# Patient Record
Sex: Female | Born: 1935 | Race: White | Hispanic: No | Smoking: Never smoker
Health system: Southern US, Community
[De-identification: ages and names within clinical notes are randomized; demographics above are authoritative.]

## PROBLEM LIST (undated history)

## (undated) DIAGNOSIS — M24529 Contracture, unspecified elbow: Secondary | ICD-10-CM

## (undated) DIAGNOSIS — E538 Deficiency of other specified B group vitamins: Secondary | ICD-10-CM

## (undated) DIAGNOSIS — E559 Vitamin D deficiency, unspecified: Secondary | ICD-10-CM

## (undated) DIAGNOSIS — R1312 Dysphagia, oropharyngeal phase: Secondary | ICD-10-CM

## (undated) DIAGNOSIS — G238 Other specified degenerative diseases of basal ganglia: Principal | ICD-10-CM

## (undated) DIAGNOSIS — Z7189 Other specified counseling: Secondary | ICD-10-CM

## (undated) DIAGNOSIS — E785 Hyperlipidemia, unspecified: Secondary | ICD-10-CM

## (undated) DIAGNOSIS — E039 Hypothyroidism, unspecified: Secondary | ICD-10-CM

## (undated) DIAGNOSIS — I10 Essential (primary) hypertension: Secondary | ICD-10-CM

## (undated) DIAGNOSIS — F329 Major depressive disorder, single episode, unspecified: Secondary | ICD-10-CM

## (undated) DIAGNOSIS — R4701 Aphasia: Secondary | ICD-10-CM

## (undated) DIAGNOSIS — S8253XA Displaced fracture of medial malleolus of unspecified tibia, initial encounter for closed fracture: Secondary | ICD-10-CM

## (undated) DIAGNOSIS — T563X1A Toxic effect of cadmium and its compounds, accidental (unintentional), initial encounter: Secondary | ICD-10-CM

## (undated) DIAGNOSIS — Z853 Personal history of malignant neoplasm of breast: Secondary | ICD-10-CM

## (undated) DIAGNOSIS — Z79899 Other long term (current) drug therapy: Secondary | ICD-10-CM

## (undated) DIAGNOSIS — S2239XA Fracture of one rib, unspecified side, initial encounter for closed fracture: Secondary | ICD-10-CM

## (undated) DIAGNOSIS — T560X1A Toxic effect of lead and its compounds, accidental (unintentional), initial encounter: Secondary | ICD-10-CM

## (undated) DIAGNOSIS — R32 Unspecified urinary incontinence: Secondary | ICD-10-CM

## (undated) DIAGNOSIS — R42 Dizziness and giddiness: Secondary | ICD-10-CM

## (undated) DIAGNOSIS — I5032 Chronic diastolic (congestive) heart failure: Secondary | ICD-10-CM

## (undated) DIAGNOSIS — R159 Full incontinence of feces: Secondary | ICD-10-CM

## (undated) DIAGNOSIS — F411 Generalized anxiety disorder: Secondary | ICD-10-CM

## (undated) HISTORY — DX: Hyperlipidemia, unspecified: E78.5

## (undated) HISTORY — DX: Other specified degenerative diseases of basal ganglia: G23.8

## (undated) HISTORY — DX: Essential (primary) hypertension: I10

## (undated) HISTORY — DX: Displaced fracture of medial malleolus of unspecified tibia, initial encounter for closed fracture: S82.53XA

## (undated) HISTORY — DX: Hypothyroidism, unspecified: E03.9

## (undated) HISTORY — DX: Contracture, unspecified elbow: M24.529

## (undated) HISTORY — DX: Vitamin D deficiency, unspecified: E55.9

## (undated) HISTORY — DX: Chronic diastolic (congestive) heart failure: I50.32

## (undated) HISTORY — DX: Personal history of malignant neoplasm of breast: Z85.3

## (undated) HISTORY — DX: Major depressive disorder, single episode, unspecified: F32.9

## (undated) HISTORY — DX: Toxic effect of cadmium and its compounds, accidental (unintentional), initial encounter: T56.3X1A

## (undated) HISTORY — DX: Generalized anxiety disorder: F41.1

## (undated) HISTORY — DX: Aphasia: R47.01

## (undated) HISTORY — DX: Other long term (current) drug therapy: Z79.899

## (undated) HISTORY — DX: Fracture of one rib, unspecified side, initial encounter for closed fracture: S22.39XA

## (undated) HISTORY — DX: Other specified counseling: Z71.89

## (undated) HISTORY — DX: Toxic effect of lead and its compounds, accidental (unintentional), initial encounter: T56.0X1A

## (undated) HISTORY — DX: Deficiency of other specified B group vitamins: E53.8

## (undated) HISTORY — DX: Unspecified urinary incontinence: R32

## (undated) HISTORY — DX: Dysphagia, oropharyngeal phase: R13.12

## (undated) HISTORY — DX: Dizziness and giddiness: R42

## (undated) HISTORY — DX: Full incontinence of feces: R15.9

---

## 1999-09-20 HISTORY — PX: OTHER SURGICAL HISTORY: SHX169

## 2004-08-16 LAB — CBC AND DIFFERENTIAL
HEMATOCRIT: 40 % (ref 36–46)
HEMOGLOBIN: 13.3 g/dL (ref 12.0–16.0)
Platelets: 275 10*3/uL (ref 150–399)
WBC: 7.9 10*3/mL

## 2004-08-16 LAB — BASIC METABOLIC PANEL
BUN: 29 mg/dL — AB (ref 4–21)
Creatinine: 0.7 mg/dL (ref 0.5–1.1)
GLUCOSE: 106 mg/dL
Potassium: 4.2 mmol/L (ref 3.4–5.3)
Sodium: 141 mmol/L (ref 137–147)

## 2004-09-19 DIAGNOSIS — Z853 Personal history of malignant neoplasm of breast: Secondary | ICD-10-CM

## 2004-09-19 HISTORY — PX: BREAST LUMPECTOMY: SHX2

## 2004-09-19 HISTORY — DX: Personal history of malignant neoplasm of breast: Z85.3

## 2008-11-13 ENCOUNTER — Encounter: Admission: RE | Admit: 2008-11-13 | Discharge: 2008-12-08 | Payer: Self-pay | Admitting: Otolaryngology

## 2009-04-21 ENCOUNTER — Encounter: Admission: RE | Admit: 2009-04-21 | Discharge: 2009-04-21 | Payer: Self-pay | Admitting: Family Medicine

## 2009-08-11 ENCOUNTER — Emergency Department (HOSPITAL_BASED_OUTPATIENT_CLINIC_OR_DEPARTMENT_OTHER): Admission: EM | Admit: 2009-08-11 | Discharge: 2009-08-11 | Payer: Self-pay | Admitting: Emergency Medicine

## 2009-08-11 ENCOUNTER — Ambulatory Visit: Payer: Self-pay | Admitting: Diagnostic Radiology

## 2009-09-19 DIAGNOSIS — F411 Generalized anxiety disorder: Secondary | ICD-10-CM

## 2009-09-19 DIAGNOSIS — R42 Dizziness and giddiness: Secondary | ICD-10-CM

## 2009-09-19 DIAGNOSIS — I5032 Chronic diastolic (congestive) heart failure: Secondary | ICD-10-CM

## 2009-09-19 DIAGNOSIS — E039 Hypothyroidism, unspecified: Secondary | ICD-10-CM

## 2009-09-19 DIAGNOSIS — I10 Essential (primary) hypertension: Secondary | ICD-10-CM

## 2009-09-19 DIAGNOSIS — S2239XA Fracture of one rib, unspecified side, initial encounter for closed fracture: Secondary | ICD-10-CM

## 2009-09-19 HISTORY — DX: Dizziness and giddiness: R42

## 2009-09-19 HISTORY — DX: Fracture of one rib, unspecified side, initial encounter for closed fracture: S22.39XA

## 2009-09-19 HISTORY — DX: Generalized anxiety disorder: F41.1

## 2009-09-19 HISTORY — DX: Hypothyroidism, unspecified: E03.9

## 2009-09-19 HISTORY — DX: Essential (primary) hypertension: I10

## 2009-09-19 HISTORY — DX: Chronic diastolic (congestive) heart failure: I50.32

## 2009-12-08 ENCOUNTER — Emergency Department (HOSPITAL_COMMUNITY): Admission: EM | Admit: 2009-12-08 | Discharge: 2009-12-08 | Payer: Self-pay | Admitting: Emergency Medicine

## 2009-12-10 ENCOUNTER — Emergency Department (HOSPITAL_COMMUNITY)
Admission: EM | Admit: 2009-12-10 | Discharge: 2009-12-10 | Payer: Self-pay | Source: Home / Self Care | Admitting: Emergency Medicine

## 2009-12-27 ENCOUNTER — Inpatient Hospital Stay (HOSPITAL_COMMUNITY): Admission: EM | Admit: 2009-12-27 | Discharge: 2010-01-01 | Payer: Self-pay | Admitting: Emergency Medicine

## 2009-12-28 ENCOUNTER — Encounter (INDEPENDENT_AMBULATORY_CARE_PROVIDER_SITE_OTHER): Payer: Self-pay | Admitting: Internal Medicine

## 2010-03-26 ENCOUNTER — Emergency Department (HOSPITAL_COMMUNITY): Admission: EM | Admit: 2010-03-26 | Discharge: 2010-03-26 | Payer: Self-pay | Admitting: Emergency Medicine

## 2010-05-30 ENCOUNTER — Emergency Department (HOSPITAL_COMMUNITY)
Admission: EM | Admit: 2010-05-30 | Discharge: 2010-05-31 | Payer: Self-pay | Source: Home / Self Care | Admitting: Emergency Medicine

## 2010-09-19 DIAGNOSIS — R1312 Dysphagia, oropharyngeal phase: Secondary | ICD-10-CM

## 2010-09-19 DIAGNOSIS — E559 Vitamin D deficiency, unspecified: Secondary | ICD-10-CM

## 2010-09-19 DIAGNOSIS — T563X1A Toxic effect of cadmium and its compounds, accidental (unintentional), initial encounter: Secondary | ICD-10-CM

## 2010-09-19 DIAGNOSIS — T560X1A Toxic effect of lead and its compounds, accidental (unintentional), initial encounter: Secondary | ICD-10-CM

## 2010-09-19 HISTORY — DX: Toxic effect of cadmium and its compounds, accidental (unintentional), initial encounter: T56.3X1A

## 2010-09-19 HISTORY — DX: Toxic effect of lead and its compounds, accidental (unintentional), initial encounter: T56.0X1A

## 2010-09-19 HISTORY — DX: Vitamin D deficiency, unspecified: E55.9

## 2010-09-19 HISTORY — DX: Dysphagia, oropharyngeal phase: R13.12

## 2010-10-10 ENCOUNTER — Encounter: Payer: Self-pay | Admitting: General Practice

## 2010-12-02 LAB — DIFFERENTIAL
Eosinophils Absolute: 0 10*3/uL (ref 0.0–0.7)
Eosinophils Relative: 0 % (ref 0–5)
Lymphocytes Relative: 8 % — ABNORMAL LOW (ref 12–46)
Monocytes Absolute: 0.6 10*3/uL (ref 0.1–1.0)
Neutrophils Relative %: 86 % — ABNORMAL HIGH (ref 43–77)

## 2010-12-02 LAB — COMPREHENSIVE METABOLIC PANEL
ALT: 23 U/L (ref 0–35)
Albumin: 4.1 g/dL (ref 3.5–5.2)
BUN: 21 mg/dL (ref 6–23)
CO2: 28 mEq/L (ref 19–32)
Calcium: 9.2 mg/dL (ref 8.4–10.5)
Chloride: 100 mEq/L (ref 96–112)
GFR calc non Af Amer: >60 mL/min (ref >60)
Total Protein: 7.5 g/dL (ref 6.0–8.3)

## 2010-12-02 LAB — URINALYSIS, ROUTINE W REFLEX MICROSCOPIC
Glucose, UA: NEGATIVE mg/dL
Protein, ur: NEGATIVE mg/dL
Urobilinogen, UA: 0.2 mg/dL (ref 0.0–1.0)

## 2010-12-02 LAB — CBC
HCT: 41.2 % (ref 36.0–46.0)
Hemoglobin: 14.6 g/dL (ref 12.0–15.0)
MCH: 30.8 pg (ref 26.0–34.0)
MCHC: 35.4 g/dL (ref 30.0–36.0)
MCV: 87.1 fL (ref 78.0–100.0)

## 2010-12-08 LAB — POCT CARDIAC MARKERS
CKMB, poc: 1.2 ng/mL (ref 1.0–8.0)
Myoglobin, poc: 115 ng/mL (ref 12–200)
Troponin i, poc: <0.05 ng/mL (ref 0.00–0.09)

## 2010-12-08 LAB — GLUCOSE, CAPILLARY

## 2010-12-08 LAB — CBC
HCT: 35.9 % — ABNORMAL LOW (ref 36.0–46.0)
HCT: 36.5 % (ref 36.0–46.0)
Hemoglobin: 12.3 g/dL (ref 12.0–15.0)
Hemoglobin: 12.4 g/dL (ref 12.0–15.0)
MCHC: 33.8 g/dL (ref 30.0–36.0)
MCHC: 34.2 g/dL (ref 30.0–36.0)
MCV: 91.4 fL (ref 78.0–100.0)
MCV: 91.7 fL (ref 78.0–100.0)
Platelets: 203 K/uL (ref 150–400)
Platelets: 237 K/uL (ref 150–400)
RBC: 3.92 MIL/uL (ref 3.87–5.11)
RBC: 3.99 MIL/uL (ref 3.87–5.11)
RDW: 13.1 % (ref 11.5–15.5)
RDW: 13.4 % (ref 11.5–15.5)
WBC: 7.2 K/uL (ref 4.0–10.5)
WBC: 8.8 K/uL (ref 4.0–10.5)

## 2010-12-08 LAB — CARDIAC PANEL(CRET KIN+CKTOT+MB+TROPI)
CK, MB: 2.7 ng/mL (ref 0.3–4.0)
CK, MB: 3.1 ng/mL (ref 0.3–4.0)
Relative Index: 2 (ref 0.0–2.5)
Relative Index: 2.1 (ref 0.0–2.5)
Total CK: 137 U/L (ref 7–177)
Total CK: 150 U/L (ref 7–177)
Troponin I: 0.02 ng/mL (ref 0.00–0.06)

## 2010-12-08 LAB — DIFFERENTIAL
Basophils Absolute: 0 10*3/uL (ref 0.0–0.1)
Basophils Relative: 0 % (ref 0–1)
Basophils Relative: 1 % (ref 0–1)
Eosinophils Absolute: 0.1 10*3/uL (ref 0.0–0.7)
Eosinophils Relative: 1 % (ref 0–5)
Neutro Abs: 4.8 10*3/uL (ref 1.7–7.7)
Neutrophils Relative %: 74 % (ref 43–77)

## 2010-12-08 LAB — COMPREHENSIVE METABOLIC PANEL
AST: 28 U/L (ref 0–37)
Alkaline Phosphatase: 75 U/L (ref 39–117)
BUN: 8 mg/dL (ref 6–23)
CO2: 28 mEq/L (ref 19–32)
Calcium: 8.6 mg/dL (ref 8.4–10.5)
GFR calc Af Amer: >60 mL/min (ref >60)
Sodium: 136 mEq/L (ref 135–145)

## 2010-12-08 LAB — D-DIMER, QUANTITATIVE: D-Dimer, Quant: 1.82 ug/mL-FEU — ABNORMAL HIGH (ref 0.00–0.48)

## 2010-12-08 LAB — BASIC METABOLIC PANEL
Calcium: 8.2 mg/dL — ABNORMAL LOW (ref 8.4–10.5)
Chloride: 101 mEq/L (ref 96–112)
GFR calc Af Amer: >60 mL/min (ref >60)
Glucose, Bld: 97 mg/dL (ref 70–99)
Sodium: 132 mEq/L — ABNORMAL LOW (ref 135–145)

## 2010-12-08 LAB — URINALYSIS, ROUTINE W REFLEX MICROSCOPIC
Bilirubin Urine: NEGATIVE
Urobilinogen, UA: 0.2 mg/dL (ref 0.0–1.0)

## 2010-12-08 LAB — CORTISOL: Cortisol, Plasma: 24.8 ug/dL

## 2010-12-08 LAB — MAGNESIUM: Magnesium: 2.1 mg/dL (ref 1.5–2.5)

## 2010-12-08 LAB — TSH: TSH: 5.967 u[IU]/mL — ABNORMAL HIGH (ref 0.350–4.500)

## 2011-03-04 ENCOUNTER — Emergency Department (HOSPITAL_COMMUNITY)
Admission: EM | Admit: 2011-03-04 | Discharge: 2011-03-04 | Disposition: A | Discharge status: Home / Self Care | Payer: Medicare Other | Attending: Emergency Medicine | Admitting: Emergency Medicine

## 2011-03-04 DIAGNOSIS — R42 Dizziness and giddiness: Secondary | ICD-10-CM | POA: Insufficient documentation

## 2011-03-04 DIAGNOSIS — R040 Epistaxis: Secondary | ICD-10-CM | POA: Insufficient documentation

## 2011-03-04 DIAGNOSIS — R112 Nausea with vomiting, unspecified: Secondary | ICD-10-CM | POA: Insufficient documentation

## 2011-03-04 LAB — CBC
MCHC: 33.3 g/dL (ref 30.0–36.0)
MCV: 85.2 fL (ref 78.0–100.0)
Platelets: 229 10*3/uL (ref 150–400)
RDW: 13.3 % (ref 11.5–15.5)
WBC: 7.3 10*3/uL (ref 4.0–10.5)

## 2011-03-04 LAB — BASIC METABOLIC PANEL
Calcium: 8.7 mg/dL (ref 8.4–10.5)
Creatinine, Ser: 0.81 mg/dL (ref 0.50–1.10)
GFR calc non Af Amer: >60 mL/min (ref >60)
Glucose, Bld: 101 mg/dL — ABNORMAL HIGH (ref 70–99)
Sodium: 136 mEq/L (ref 135–145)

## 2011-03-04 LAB — PROTIME-INR: INR: 1.11 (ref 0.00–1.49)

## 2011-03-04 LAB — DIFFERENTIAL
Basophils Absolute: 0 10*3/uL (ref 0.0–0.1)
Eosinophils Absolute: 0.1 10*3/uL (ref 0.0–0.7)
Eosinophils Relative: 1 % (ref 0–5)

## 2011-03-04 LAB — TYPE AND SCREEN: Antibody Screen: NEGATIVE

## 2011-04-15 IMAGING — CR DG RIBS W/ CHEST 3+V*L*
3 series · 3 of 3 positions shown · non-contrast
Comparison: 08/11/2009

CLINICAL DATA: Fall, left-sided rib pain

LEFT RIBS AND CHEST - 3+ VIEW

[w chest pa]
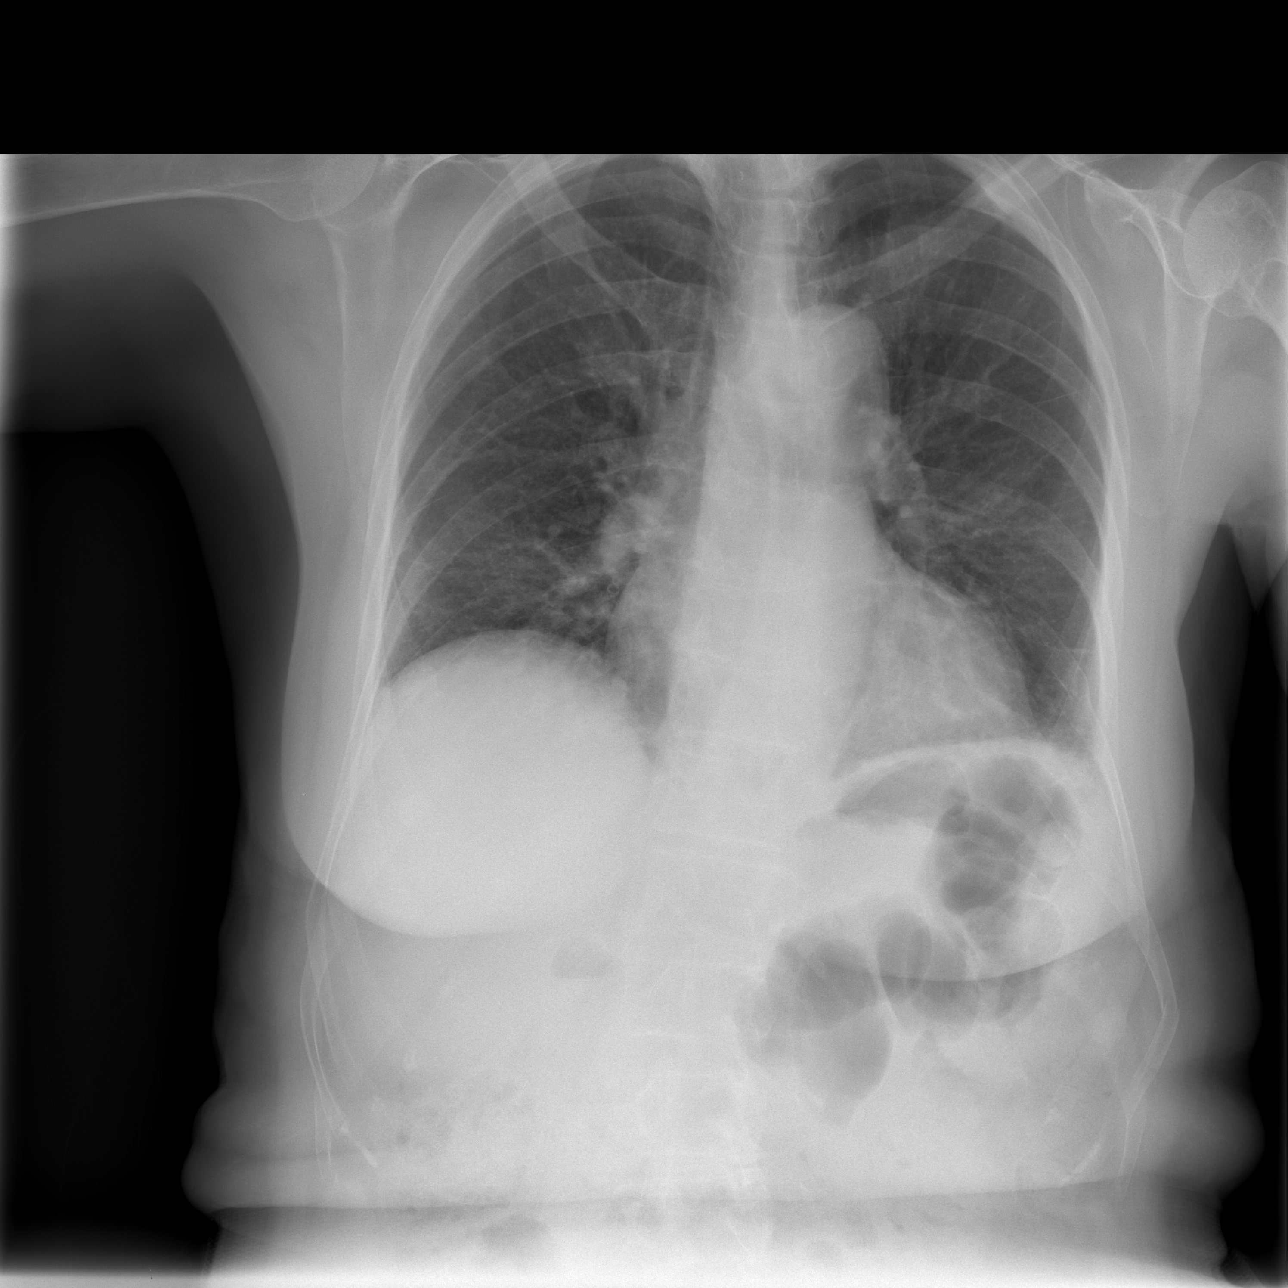

[w ribs ap/pa upper left *]
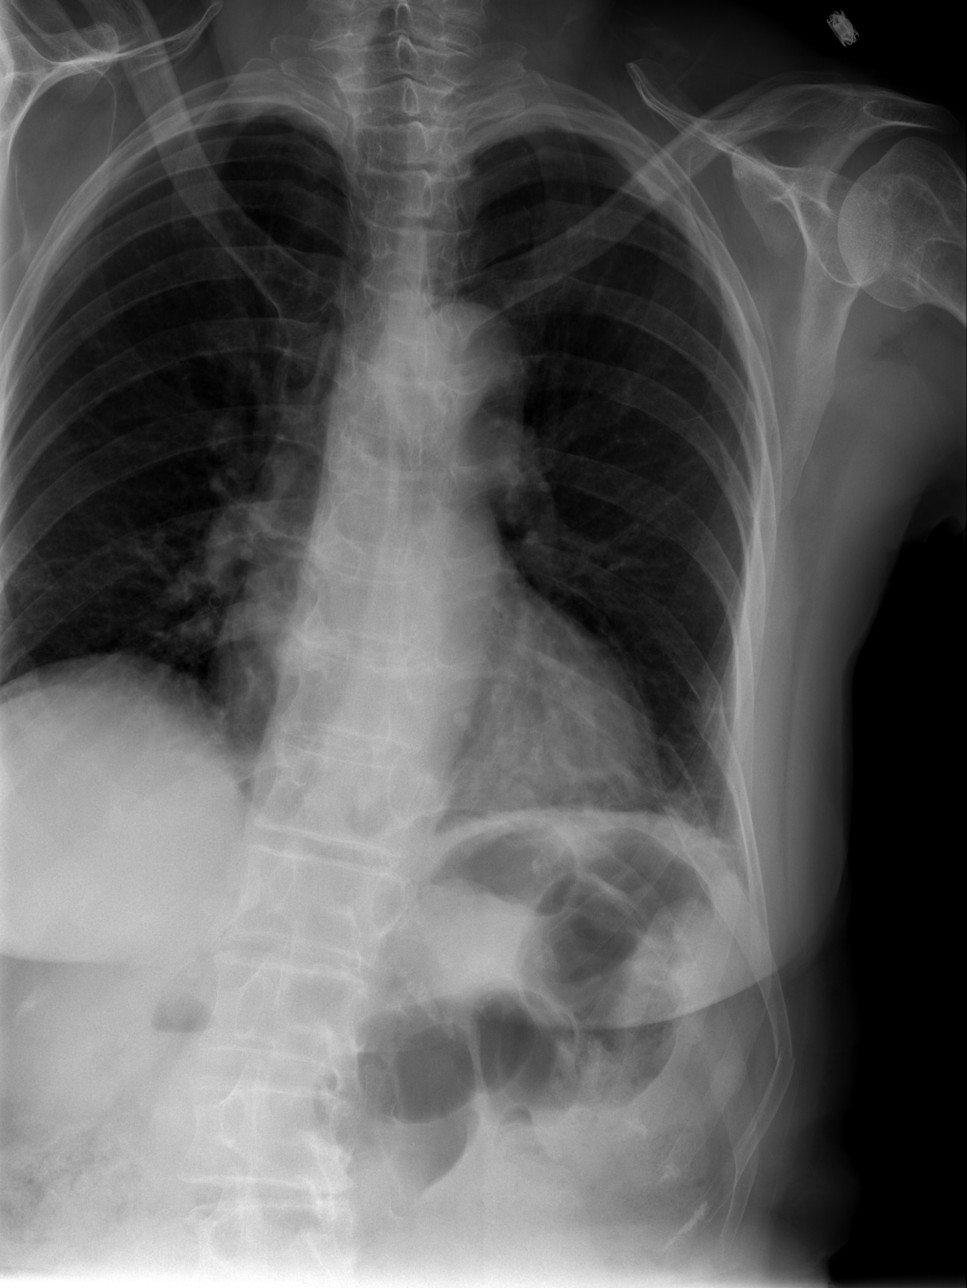

[w ribs ap/pa lower left *]
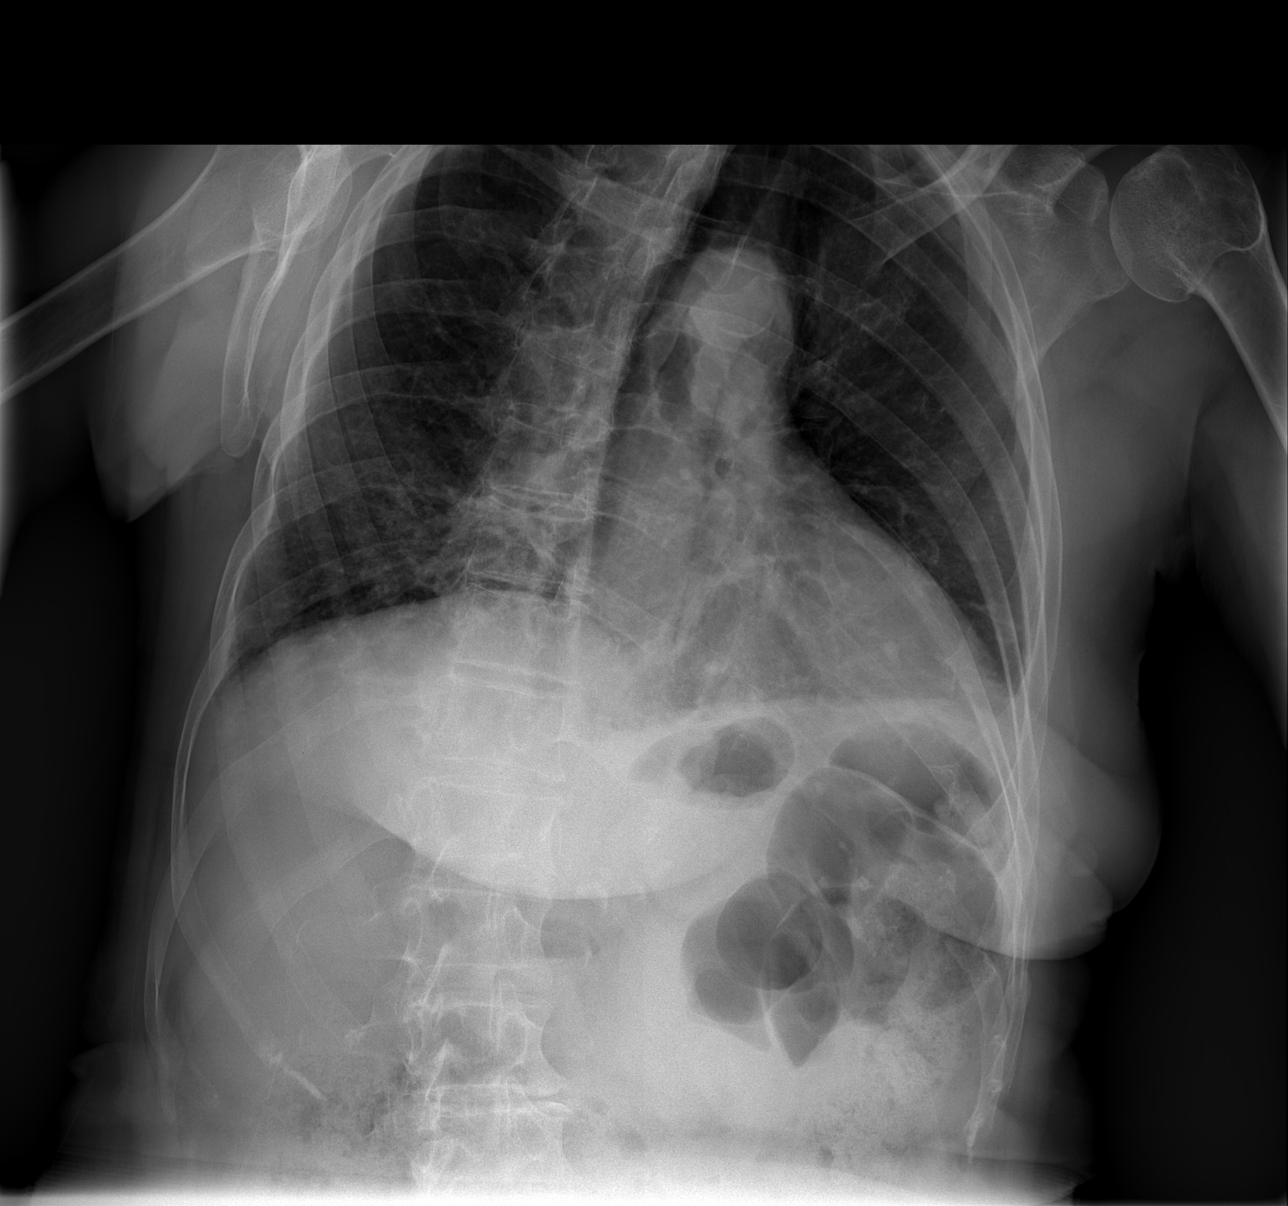

[3 of 3 positions shown; findings below may reference images not displayed]

FINDINGS: Remote fractures of the left ninth and tenth posterior
ribs again noted.  There is a new acute lateral left 10th rib
fracture.  Rotatory curvature of the thoracolumbar spine is noted
which is likely positional.  No pneumothorax.  Left basilar
atelectasis is present.  Heart size is at the upper limits of
normal.  Right lung is clear.
IMPRESSION: Acute left lateral tenth rib fracture.  No pneumothorax.

## 2011-04-19 IMAGING — CT CT ANGIO CHEST
2 of 7 series · 19 of 46 positions shown · IV contrast (APPLIED)
Comparison: CXR 12/29/2009.

CLINICAL DATA: Elevated D-dimer.  Short of breath. Chest pain.
Evaluate for PE.

CT ANGIOGRAPHY CHEST WITH CONTRAST
TECHNIQUE: Multidetector CT imaging of the chest was performed
using the standard protocol during bolus administration of
intravenous contrast.  Multiplanar CT image reconstructions
including MIPs were obtained to evaluate the vascular anatomy.
Contrast:  80 ml Dmnipaque-1GG IV.

[Series 6: pe thins @ 1mm · axial · 0.70mm/px · z∈[-271,-14]mm · 16 of 291 slices shown]
[im 17/291  lung]
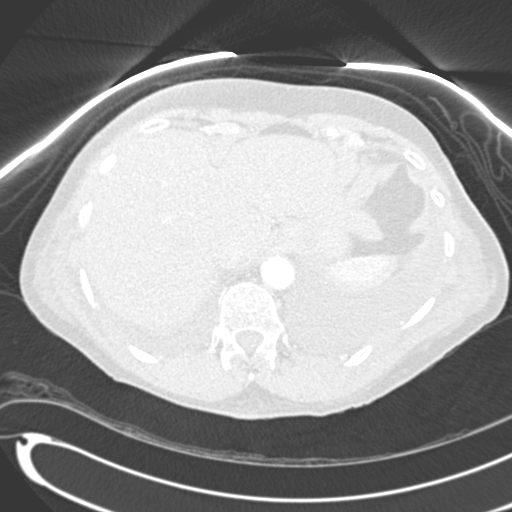
[im 33/291  soft-tissue]
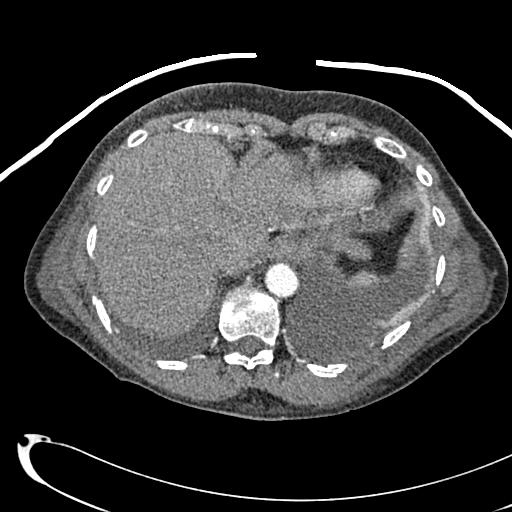
[im 49/291  lung]
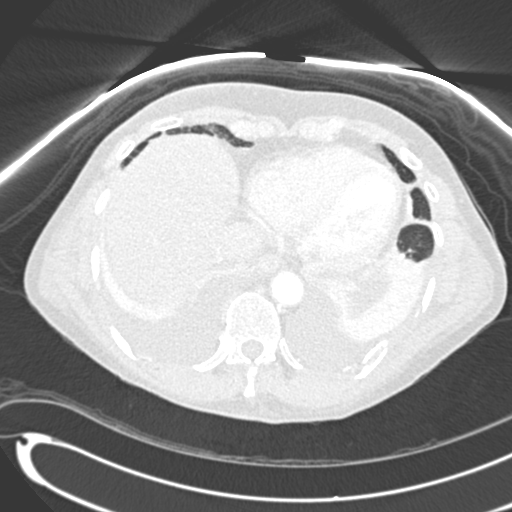
[im 65/291  soft-tissue]
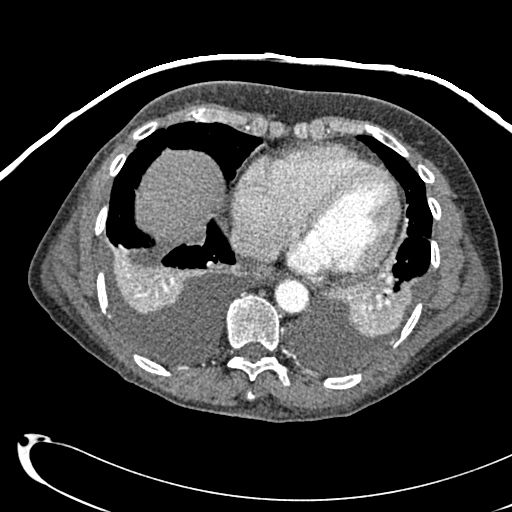
[im 81/291  lung]
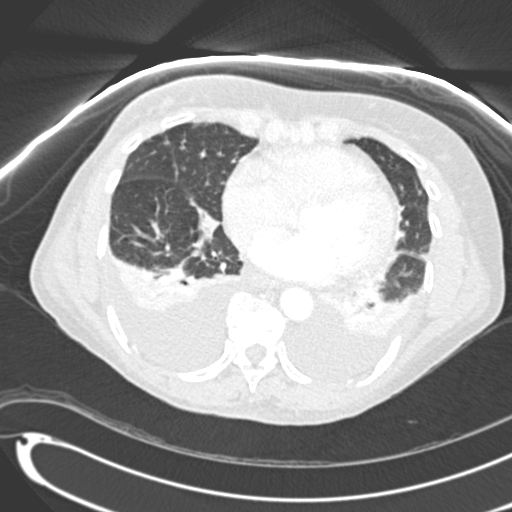
[im 97/291  soft-tissue]
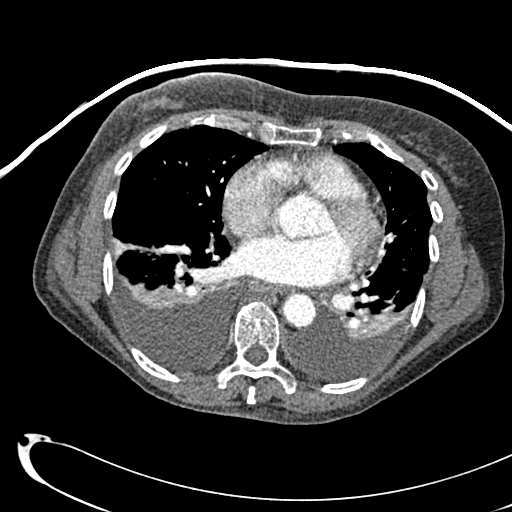
[im 113/291  lung]
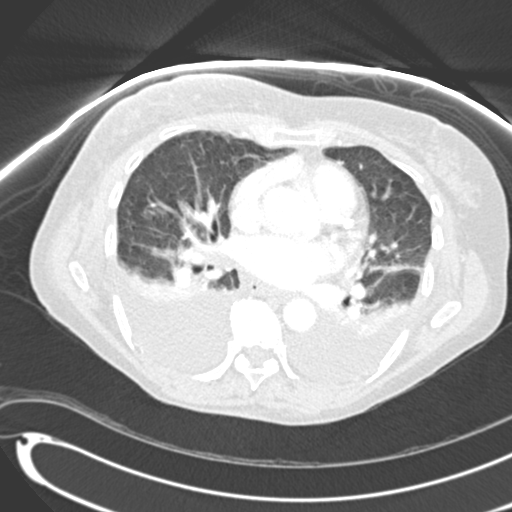
[im 129/291  soft-tissue]
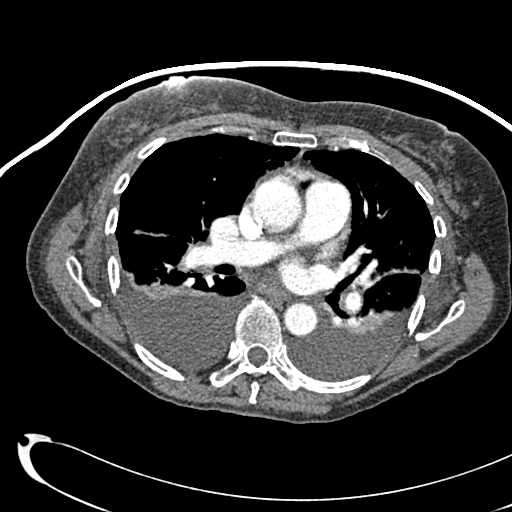
[im 162/291  lung]
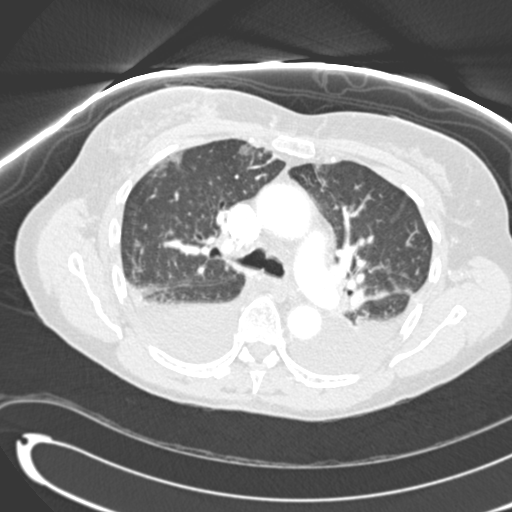
[im 178/291  soft-tissue]
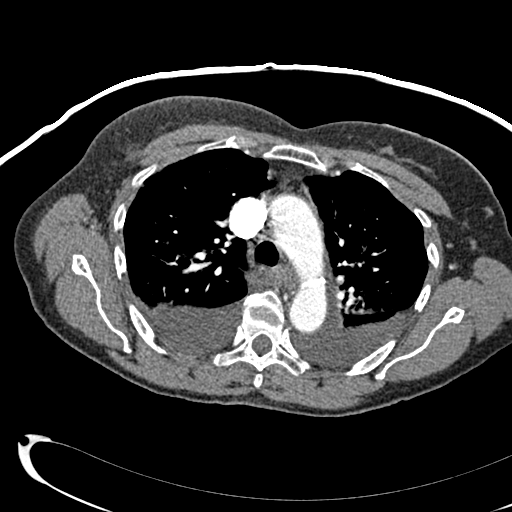
[im 194/291  lung]
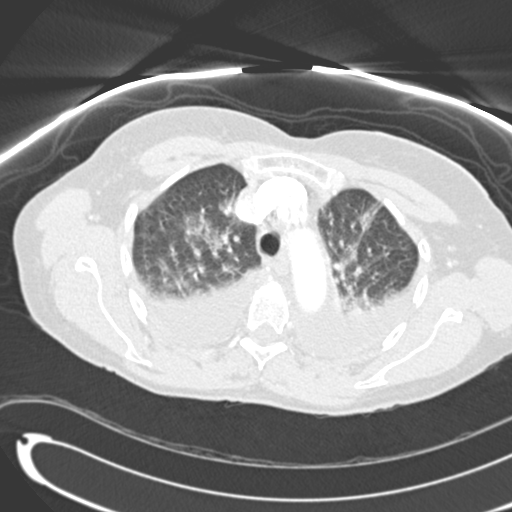
[im 210/291  soft-tissue]
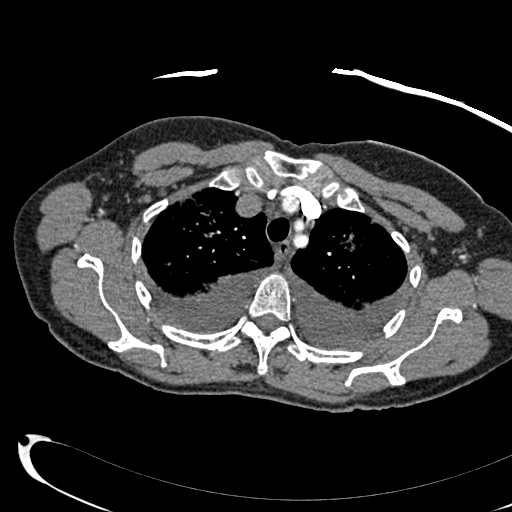
[im 226/291  lung]
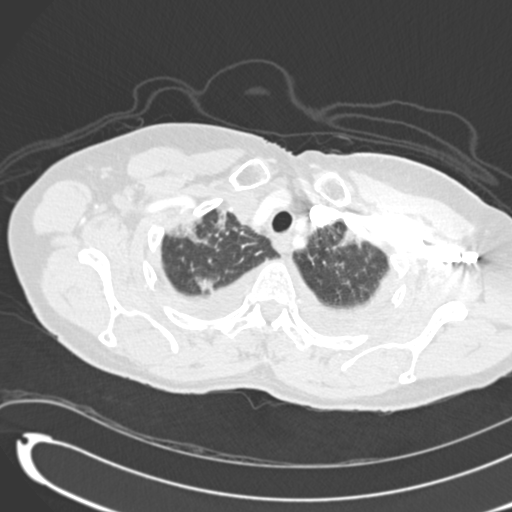
[im 242/291  soft-tissue]
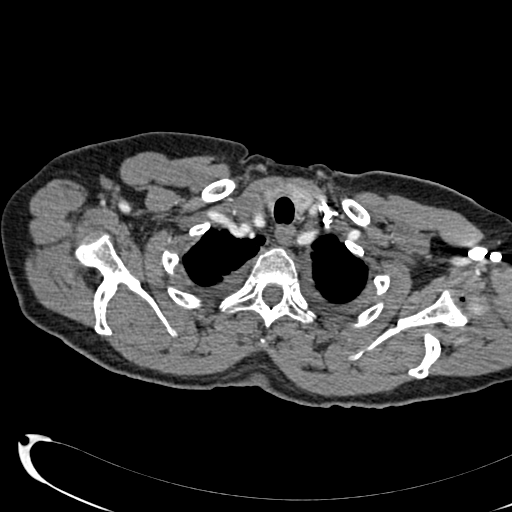
[im 258/291  lung]
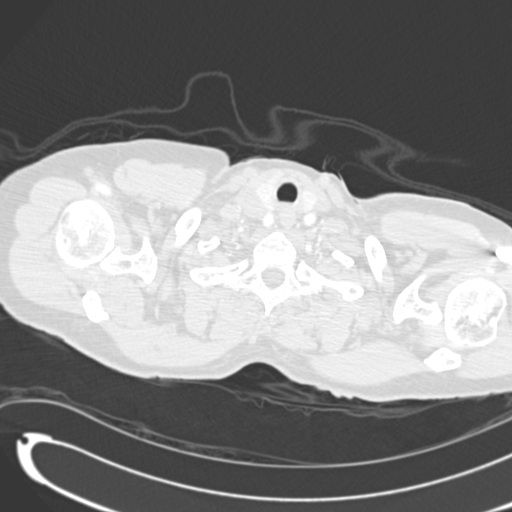
[im 274/291  soft-tissue]
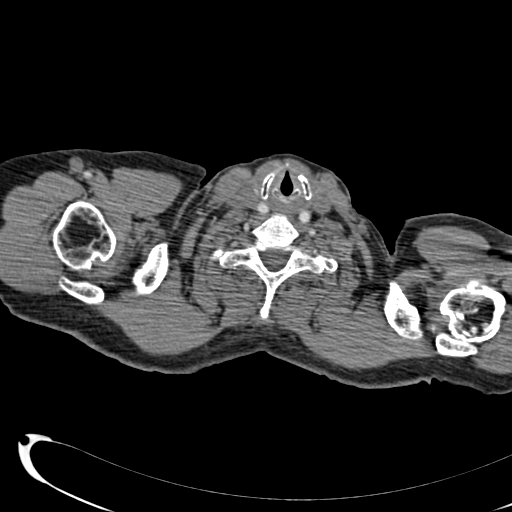

[Series 602: coronal images · coronal · 0.70mm/px · 3 of 115 slices shown]
[im 29/115  soft-tissue]
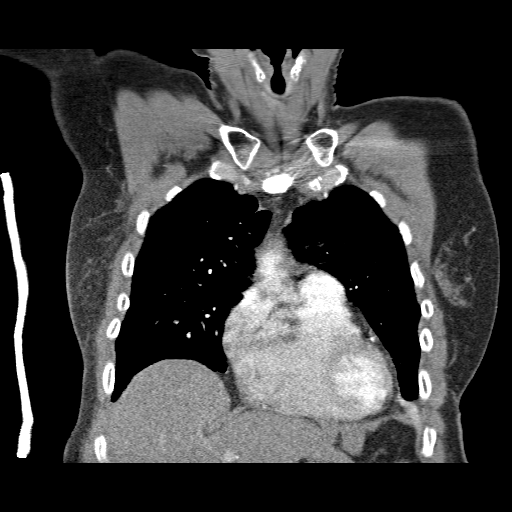
[im 58/115  soft-tissue]
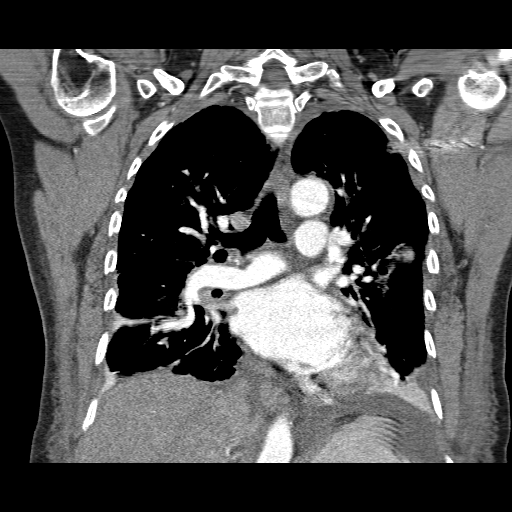
[im 86/115  soft-tissue]
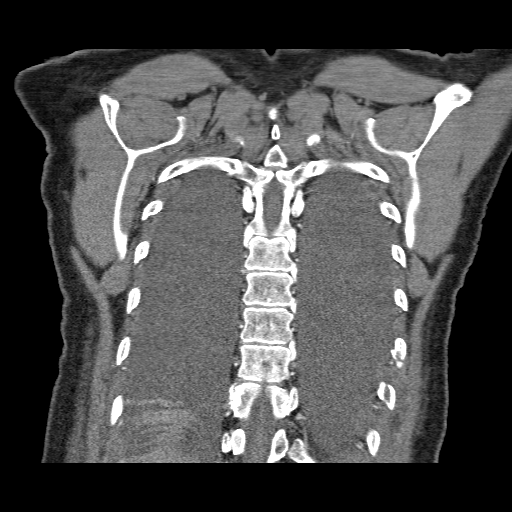

[19 of 46 positions shown; findings below may reference images not displayed]

FINDINGS: Negative for PE.  Prominent caliber of the main
pulmonary artery segments.  The central pulmonary artery trunk
measures 3.3 cm in diameter.  This is compatible with pulmonary
arterial hypertension.

There is atelectasis at the bases and bilateral pleural effusions.
Patchy airspace opacities in the upper lobes may represent
pulmonary edema.  There is septal thickening.

Review of the MIP images confirms the above findings.
IMPRESSION: Negative for PE.  Bilateral pleural effusions and bilateral lower
lobe atelectasis.  Patchy airspace opacities in the upper lobes
suspicion for pulmonary edema. The combination of findings may be
due to CHF.

This exam was reviewed with Dr.Karthik.

## 2011-09-20 DIAGNOSIS — R32 Unspecified urinary incontinence: Secondary | ICD-10-CM

## 2011-09-20 DIAGNOSIS — R159 Full incontinence of feces: Secondary | ICD-10-CM

## 2011-09-20 DIAGNOSIS — E538 Deficiency of other specified B group vitamins: Secondary | ICD-10-CM

## 2011-09-20 HISTORY — DX: Full incontinence of feces: R15.9

## 2011-09-20 HISTORY — DX: Unspecified urinary incontinence: R32

## 2011-09-20 HISTORY — DX: Deficiency of other specified B group vitamins: E53.8

## 2012-01-04 ENCOUNTER — Other Ambulatory Visit (HOSPITAL_COMMUNITY): Payer: Self-pay | Admitting: *Deleted

## 2012-01-10 ENCOUNTER — Ambulatory Visit (HOSPITAL_COMMUNITY)
Admission: RE | Admit: 2012-01-10 | Discharge: 2012-01-10 | Disposition: A | Discharge status: Home / Self Care | Payer: Medicare Other | Source: Ambulatory Visit | Attending: Internal Medicine | Admitting: Internal Medicine

## 2012-01-10 DIAGNOSIS — E559 Vitamin D deficiency, unspecified: Secondary | ICD-10-CM | POA: Insufficient documentation

## 2012-01-10 DIAGNOSIS — R1313 Dysphagia, pharyngeal phase: Secondary | ICD-10-CM | POA: Insufficient documentation

## 2012-01-10 DIAGNOSIS — E039 Hypothyroidism, unspecified: Secondary | ICD-10-CM | POA: Insufficient documentation

## 2012-01-10 DIAGNOSIS — R059 Cough, unspecified: Secondary | ICD-10-CM | POA: Insufficient documentation

## 2012-01-10 DIAGNOSIS — F329 Major depressive disorder, single episode, unspecified: Secondary | ICD-10-CM | POA: Insufficient documentation

## 2012-01-10 DIAGNOSIS — R05 Cough: Secondary | ICD-10-CM | POA: Insufficient documentation

## 2012-01-10 DIAGNOSIS — F3289 Other specified depressive episodes: Secondary | ICD-10-CM | POA: Insufficient documentation

## 2012-01-10 DIAGNOSIS — F0390 Unspecified dementia without behavioral disturbance: Secondary | ICD-10-CM | POA: Insufficient documentation

## 2012-01-10 NOTE — Procedures (Signed)
Objective Swallowing Evaluation: Modified Barium Swallowing Study  Patient Details  Name: Jessica Hinton MRN: [id number] Date of Birth: 01-07-1931  Today's Date: 01/10/2012 Time:  -     Past Medical History: No past medical history on file. Past Surgical History: No past surgical history on file. HPI:  Pt is a 78 yo female resident of [facility] referred by SNF SLP and MD for MBS secondary to concerns pt may be aspirating.  PMH + for ? PSP (? diagnosed in 2012) vs ? CVA (documented in physician home visit report)- unclear given documentation.  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  Per SNF SLP present, to her knowledge, pt had not been seen by SLP prior to admission to [facility] .  Pt with intermittent coughing during meals and at times will point to upper esophagus indicating possible pain.  Pt without weight loss or pnas per SNF SLP.  Pt uses a "sippy cup" which has aided in slowing pt's amount of intake.       Recommendation/Prognosis  Clinical Impression Dysphagia Diagnosis: Mild pharyngeal phase dysphagia;Mild oral phase dysphagia Clinical impression: Pt presents with mild oropharyngeal dysphagia without aspiration or penetration of any consistency tested.  Unfortunately, pt did not cough throughout entire evaluation, as observed during meals.  Pt's oral dysphagia characterized by decr oral coordination resulting in premature spillage and oral stasis spilling into pharynx without pt awareness  Pt minimally masticated cracker and had retention of nearly 1/2 at her pharynx WITHOUT awareness.  Pharyngeal dysphagia characterized by sensory deficits resulting in delayed pharyngeal swallow initiation (to pyriform sinus with liquids) that was inconsistent.   Tongue base and vallecular stasis noted without pt awareness - liquids aid clearance of pudding - but pt did  not follow commands to dry swallow.  Suspect pt may episodically aspirate given her dysphagia.  Also question if pt with xerostomia due to viscous secretions retained in oral cavity, and suspect xerostomia could contribute to her ? pain with intake.  Biotene rinse may be helpful for ? xerostomia.  Due to cognitive deficits, chin tuck, head turn, etc not tested and do not anticipate they would've been helpful. Rec mechanical soft/ground meats/thin water with meals - other liquids between meals with supervision.  Thanks for this consult.   Swallow Evaluation Recommendations Diet Recommendations: Dysphagia 3 (Mechanical Soft);Thin liquid (ground meats, extra gravy/sauce, water with meals preferred due to pH neutral status) Medication Administration: Whole meds with puree (or crushed if problematic) Supervision: Full supervision/cueing for compensatory strategies Compensations: Follow solids with liquid;Slow rate;Small sips/bites Postural Changes and/or Swallow Maneuvers: Upright 30-60 min after meal Oral Care Recommendations: Oral care QID (? xerostomia - viscous secretions) Follow up Recommendations: Skilled Nursing facility   Individuals Consulted Consulted and Agree with Results and Recommendations: Other (Comment) (SNF SLP) Report Sent to : Primary SLP;Facility (Comment) [facility])  SLP Assessment/Plan Dysphagia Diagnosis: Mild pharyngeal phase dysphagia;Mild oral phase dysphagia Clinical impression: Pt presents with mild oropharyngeal dysphagia without aspiration or penetration of any consistency tested.  Unfortunately, pt did not cough throughout entire evaluation, as observed during meals.  Pt's oral dysphagia characterized by decr oral coordination resulting in premature spillage and oral stasis spilling into pharynx without pt awareness  Pt minimally masticated cracker and had retention of nearly 1/2 at her pharynx WITHOUT awareness.  Pharyngeal dysphagia characterized by sensory deficits  resulting in delayed pharyngeal swallow initiation (to pyriform sinus with liquids)  that was inconsistent.   Tongue base and vallecular stasis noted without pt awareness - liquids aid clearance of pudding - but pt did not follow commands to dry swallow.  Suspect pt may episodically aspirate given her dysphagia.  Also question if pt with xerostomia due to viscous secretions retained in oral cavity, and suspect xerostomia could contribute to her ? pain with intake.  Biotene rinse may be helpful for ? xerostomia.  Due to cognitive deficits, chin tuck, head turn, etc not tested and do not anticipate they would've been helpful. Rec mechanical soft/ground meats/thin water with meals - other liquids between meals with supervision.  Thanks for this consult.    SLP Goals   n/a, pt outpt  General:  HPI: Pt is a 78 yo female resident of [facility] referred by SNF SLP and MD for MBS secondary to concerns pt may be aspirating.  PMH + for ? PSP (? diagnosed in 2012) vs ? CVA (documented in physician home visit report)- unclear given documentation.  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  Per SNF SLP present, to her knowledge, pt had not been seen by SLP prior to admission to [facility] .  Pt with intermittent coughing during meals and at times will point to upper esophagus indicating possible pain.  Pt without weight loss or pnas per SNF SLP.  Pt uses a "sippy cup" which has aided in slowing pt's amount of intake.   Type of Study: Modified Barium Swallowing Study Diet Prior to this Study: Regular;Thin liquids (carb mod) Respiratory Status: Room air Behavior/Cognition: Alert;Doesn't follow directions;Requires cueing;Decreased sustained attention Oral Cavity - Dentition: Adequate natural dentition Oral Motor / Sensory Function: Impaired motor;Impaired sensory (slow to respond,  difficult to fully assess) Oral impairment:  (viscous oral secretions, bilateral) Vision: Impaired for self-feeding (assist to locate cup, spoon, etc) Patient Positioning: Upright in chair Baseline Vocal Quality: Clear Volitional Cough: Weak Volitional Swallow: Unable to elicit Anatomy: Within functional limits Pharyngeal Secretions: Not observed secondary MBS  Reason for Referral:  See history  Oral Phase Oral Preparation/Oral Phase Oral Phase: Impaired Oral - Nectar Oral - Nectar Cup: Reduced posterior propulsion;Delayed oral transit;Piecemeal swallowing;Weak lingual manipulation Oral - Thin Oral - Thin Teaspoon: Reduced posterior propulsion;Delayed oral transit;Weak lingual manipulation Oral - Thin Cup: Piecemeal swallowing;Delayed oral transit;Reduced posterior propulsion;Weak lingual manipulation;Lingual/palatal residue Oral - Thin Straw: Piecemeal swallowing;Delayed oral transit;Reduced posterior propulsion;Weak lingual manipulation;Lingual/palatal residue Oral - Solids Oral - Regular: Impaired mastication;Weak lingual manipulation;Delayed oral transit;Piecemeal swallowing (pt did not fully masticate cracker) Oral - Pill: Other (Comment) (pt did not orally transit tablet, expectorated) Oral Phase - Comment Oral Phase - Comment: Mild delay in oral transit, mild discoordination with boluses prematurely spilling into pharynx at times - not consistent, premature spillage of oral stasis without pt awarness Pharyngeal Phase  Pharyngeal Phase Pharyngeal Phase: Impaired Pharyngeal - Nectar Pharyngeal - Nectar Cup: Premature spillage to pyriform;Premature spillage to valleculae;Delayed swallow initiation Pharyngeal - Thin Pharyngeal - Thin Teaspoon: Premature spillage to pyriform;Delayed swallow initiation Pharyngeal - Thin Cup: Premature spillage to pyriform;Premature spillage to valleculae;Delayed swallow initiation Pharyngeal - Thin Straw: Premature spillage to valleculae;Delayed  swallow initiation Pharyngeal - Solids Pharyngeal - Regular: Premature spillage to valleculae;Delayed swallow initiation;Pharyngeal residue - valleculae;Compensatory strategies attempted (Comment) (mod stasis without awareness, pudding assisted to clear) Pharyngeal Phase - Comment Pharyngeal Comment: pudding assisted to clear cracker vallecular stasis better than thin, delay in swallow  initiation was not consistent - pt did not conduct cued dry swallows due to cognitive deficit Cervical Esophageal Phase  Cervical Esophageal Phase Cervical Esophageal Phase: [facility] Cervical Esophageal Phase - Comment Cervical Esophageal Comment: Esophagus appeared without signficant stasis with multiple sweeps - radiologist not present to confirm-    Donavan Burnet, MS [facility] SLP [phone]

## 2012-07-20 DIAGNOSIS — S8253XA Displaced fracture of medial malleolus of unspecified tibia, initial encounter for closed fracture: Secondary | ICD-10-CM

## 2012-07-20 HISTORY — DX: Displaced fracture of medial malleolus of unspecified tibia, initial encounter for closed fracture: S82.53XA

## 2013-04-22 ENCOUNTER — Encounter: Payer: Self-pay | Admitting: Geriatric Medicine

## 2013-04-22 ENCOUNTER — Non-Acute Institutional Stay (SKILLED_NURSING_FACILITY): Payer: Medicare Other | Admitting: Geriatric Medicine

## 2013-04-22 DIAGNOSIS — E039 Hypothyroidism, unspecified: Secondary | ICD-10-CM

## 2013-04-22 DIAGNOSIS — F329 Major depressive disorder, single episode, unspecified: Secondary | ICD-10-CM

## 2013-04-22 DIAGNOSIS — Z79899 Other long term (current) drug therapy: Secondary | ICD-10-CM

## 2013-04-22 DIAGNOSIS — R4701 Aphasia: Secondary | ICD-10-CM

## 2013-04-22 DIAGNOSIS — F3289 Other specified depressive episodes: Secondary | ICD-10-CM

## 2013-04-22 DIAGNOSIS — M24529 Contracture, unspecified elbow: Secondary | ICD-10-CM

## 2013-04-22 DIAGNOSIS — E785 Hyperlipidemia, unspecified: Secondary | ICD-10-CM

## 2013-04-22 DIAGNOSIS — G238 Other specified degenerative diseases of basal ganglia: Secondary | ICD-10-CM

## 2013-04-22 DIAGNOSIS — Z66 Do not resuscitate: Secondary | ICD-10-CM

## 2013-04-22 HISTORY — DX: Other long term (current) drug therapy: Z79.899

## 2013-04-22 HISTORY — DX: Other specified degenerative diseases of basal ganglia: G23.8

## 2013-04-22 HISTORY — DX: Major depressive disorder, single episode, unspecified: F32.9

## 2013-04-22 HISTORY — DX: Hyperlipidemia, unspecified: E78.5

## 2013-04-22 HISTORY — DX: Aphasia: R47.01

## 2013-04-22 HISTORY — DX: Other specified depressive episodes: F32.89

## 2013-04-22 HISTORY — DX: Contracture, unspecified elbow: M24.529

## 2013-04-22 NOTE — Assessment & Plan Note (Signed)
Celexa restarted 3 2014. No recent report of any signs or symptoms of depression. Continue medication. Check electrolytes.

## 2013-04-22 NOTE — Assessment & Plan Note (Signed)
01/2013 MDS reviewed. Patient isunable to participate in BIMS or PHQ-9. She continues to require extensive assistance for all activities.  Transfers with poor left. Is essentially nonverbal but does communicate and nonverbal manner. Continues to attend activities. Family remains involved in visits often.

## 2013-04-22 NOTE — Assessment & Plan Note (Signed)
Lipid levels remain satisfactory, no medication required.

## 2013-04-22 NOTE — Assessment & Plan Note (Signed)
No signs or symptoms of hypothyroidism, most recent TSH satisfactory. Continue current supplement.

## 2013-04-22 NOTE — Progress Notes (Signed)
Patient ID: Jessica Hinton, female   DOB: 03-28-1931, 79 y.o.   MRN: [id number] [facility] [phone])  Code Status: DNR Contact Information   Name Relation Home Work Mobile   [name],[name] Daughter [phone]        Chief Complaint  Patient presents with  . Medical Managment of Chronic Issues    PSP, thyroid, depression    HPI: This is a 79 y.o. female resident of [facility], Skilled Nursing section evaluated today for management of ongoing medical issues.  Review of record shows  No significant acute changes. Patient's vital signs and weight have been stable. She said no she is tolerating her current diet adequately. Review of record shows patient remains very debilitated related to PSP; she is essentially nonverbal, has limited interactions, is nearly immobile. OT has completed interventions to help prevent contracture of her right elbow. Nursing staff applies right elbow splint twice daily. Patient has not exhibited any signs or symptoms of hypothyroidism.  Antidepression medicine was tapered and stopped February 2014, was restarted in March 2014 22 symptoms of depression. The patient is tolerating this medication no report of depressive symptoms.   Bathing: Dependent  Bed Mobility: Maximal Assist Bladder Management: Diapers / Pads, Bowel Management: diapers. Pads Feeding: Maximal Assist Hygiene and Grooming: Maximal Assist, Toileting / Clothing: Maximal Assist  Transfers: Dependent, Wheelchair: Dependent   No Known Allergies Medications  reviewed   DATA REVIEWED  Radiologic Exams:   [facility] x-ray 07/02/2012 x-ray right ankle, comparison 6 2012. Old posttraumatic changes in the medial malleolus are present. There is now 1 mm chip fracture the medial aspect of the medial malleolus not noted on previous survey of 03/10/2011.  X-ray right foot, no comparison. No definite acute bony abnormalities the right foot can be  identified  08/03/2012 X-ray of right ankle AP, lateral and oblique views, compared to 07/02/2012: Small ununited 1 mm chip fracture medial malleolus difficult to date with associated regularity of medial malleolar area again difficult to date. Excellent position and alignment of the medial malleolus is seen with no findings present to suggest acute fracture or dislocation  Cardiovascular Exams:   Laboratory Studies:  [facility] 11/22/11 WBC 6.4, Hgb 13.9, Hct 40.6, Plt 237 Glu 91, BUN 13, Cr. .78, na 143, K+4.0. LFTs WNL. Prot 5.6, alb 3.3 TC 169, TG 168, HDL 25, LDL 110 TSH 5.62 A1C 5.5 06/28/12 Glu 75, BUN 20, Cr. .74, Na 140, K+ 4.1 TC 160, TG 131, HDL 28, LDL 106  01/01/2013 WBC 6.7, hemoglobin 14.6, hematocrit 42.3, platelets 227  Glucose 82, BUN 24, creatinine 0.77, sodium 140, potassium 4.2. LFTs/proteins WNL.  Total cholesterol 239, triglyceride 155, HDL 33, LDL 175   TSH 3.18     Review of Systems  DATA OBTAINED: from  nurse, medical record, GENERAL:  No fevers, fatigue, change in appetite or weight SKIN: No itch, rash or open wounds EYES: No eye pain, dryness or itching on EARS: No earache, change in hearing NOSE: No congestion, drainage or bleeding MOUTH/THROAT: No mouth or tooth pain  No sore throat No difficulty chewing or swallowing RESPIRATORY: No cough, wheezing, SOB CARDIAC: No chest pain,  No edema. GI: No abdominal pain  No N/V/D or constipation  Incontinent  GU: No dysuria, frequency or urgency  No change in urine volume or character Incontinent  MUSCULOSKELETAL: No joint pain, swelling   No back pain  Not ambulatory   No recent falls.  NEUROLOGIC: No dizziness, fainting, headache, No change  in mental status.   Physical Exam Filed Vitals:   04/22/13 1621  BP: 132/76  Pulse: 67  Temp: 97.4 F (36.3 C)  Resp: 16  Height: 5\' 5"  (1.651 m)  Weight: 141 lb 14.4 oz (64.365 kg)  SpO2: 96%   Body mass index is 23.61 kg/(m^2). GENERAL APPEARANCE: No acute  distress, appropriately groomed, normal body habitus. Alert, pleasant, nonverbal  SKIN: No diaphoresis, rash, unusual lesions, wounds HEAD: Normocephalic, atraumatic EYES: Conjunctiva/lids clear. Pupils round, reactive. Marland Kitchen  EARS: External exam WNL, unable to assess hearing NOSE: No deformity or discharge. MOUTH/THROAT: Lips w/o lesions. Will not open mouth today   NECK:  No thyroid tenderness, enlargement or nodule LYMPHATICS: No head, neck or supraclavicular adenopathy RESPIRATORY: Breathing is even, unlabored. Lung sounds are clear and full.  CARDIOVASCULAR: Heart RRR. No murmur or extra heart sounds  ARTERIAL: No carotid bruit.   VENOUS: No varicosities. No venous stasis skin changes  EDEMA: No peripheral edema.  GASTROINTESTINAL: Abdomen is soft, non-tender, not distended w/ normal bowel sounds.  MUSCULOSKELETAL: No spontaneous movement. Passive movement of joints is easy except right elbow this is stiff and will not extend fully. Back is without kyphosis, scoliosis or spinal process tenderness.  NEUROLOGIC: Unable to assess orientation, patient is nonverbal , no tremor.Marland Kitchen PSYCHIATRIC: Mood and affect appropriate to situation   ASSESSMENT/PLAN  Other and unspecified hyperlipidemia Lipid levels remain satisfactory, no medication required.  Depressive disorder, not elsewhere classified Celexa restarted 3 2014. No recent report of any signs or symptoms of depression. Continue medication. Check electrolytes.  Other degenerative diseases of the basal ganglia  01/2013 MDS reviewed. Patient isunable to participate in BIMS or PHQ-9. She continues to require extensive assistance for all activities.  Transfers with poor left. Is essentially nonverbal but does communicate and nonverbal manner. Continues to attend activities. Family remains involved in visits often.  Unspecified hypothyroidism No signs or symptoms of hypothyroidism, most recent TSH satisfactory. Continue current  supplement.   Lab:  8/45/14 BMP  Follow up: Routine or as needed  Yann Biehn T.Lamari Youngers, NP-C 04/22/2013

## 2013-04-23 LAB — BASIC METABOLIC PANEL WITH GFR
BUN: 23 mg/dL — AB (ref 4–21)
Creatinine: 0.8 mg/dL (ref 0.5–1.1)
Glucose: 93 mg/dL
Potassium: 3.9 mmol/L (ref 3.4–5.3)
Sodium: 139 mmol/L (ref 137–147)

## 2013-05-15 DIAGNOSIS — R1312 Dysphagia, oropharyngeal phase: Secondary | ICD-10-CM | POA: Insufficient documentation

## 2013-07-15 ENCOUNTER — Non-Acute Institutional Stay (SKILLED_NURSING_FACILITY): Payer: Medicare Other | Admitting: Geriatric Medicine

## 2013-07-15 ENCOUNTER — Encounter: Payer: Self-pay | Admitting: Geriatric Medicine

## 2013-07-15 DIAGNOSIS — F329 Major depressive disorder, single episode, unspecified: Secondary | ICD-10-CM

## 2013-07-15 DIAGNOSIS — G238 Other specified degenerative diseases of basal ganglia: Secondary | ICD-10-CM

## 2013-07-15 DIAGNOSIS — R1312 Dysphagia, oropharyngeal phase: Secondary | ICD-10-CM

## 2013-07-15 DIAGNOSIS — E039 Hypothyroidism, unspecified: Secondary | ICD-10-CM

## 2013-07-15 NOTE — Assessment & Plan Note (Addendum)
Re: PSP. Diet changed to Pureed recently. Pt. Is tolerating well, appears adequately hydrated.

## 2013-07-15 NOTE — Assessment & Plan Note (Addendum)
Tolerating medication w/o adverse effects, difficult to measure if effective; pt. Is less interactive/ communicative in general.  Continue medication for now

## 2013-07-15 NOTE — Assessment & Plan Note (Addendum)
Further decline re: PSP as evidenced by swallow dysfunction, decreased interactions. Daughter has previously requested focus of care be comfort. Recommend SW initiate discussion re: MOST form at next Care Plan. Continue supportive care

## 2013-07-15 NOTE — Assessment & Plan Note (Addendum)
Unchanged, continue current supplement.

## 2013-07-15 NOTE — Progress Notes (Signed)
Patient ID: Jessica Hinton, female   DOB: [birth year], 79 y.o.   MRN: [id number] [facility] [phone])  Code Status: DNR  Contact Information   Name Relation Home Work Mobile   [name],[name] Daughter [phone]        Chief Complaint  Patient presents with  . Medical Managment of Chronic Issues    Annual update/ exam    HPI: This is an 79 y.o. female resident of [facility] Nursing section. This patient has not had a hospitalization, serious acute illness or injury in the last year. She did sustain a chip fracture of right medial malleolus 06/2012. Treated with immobilization, healed w/o complication. Review of facility record shows patient's vital signs and weight have been stable. She is tolerating her current diet adequately.  Patient has demonstrated further decline due to neurodegenerative disorder (PSP). Requires extensive assist with all ADLs, is essentially nonverbal, has limited interactions, is nearly immobile. Attends but soes not participate in activities. Diet was recently downgraded to pureed due to coughing with meals. Was evaluated by ST for most appropriate meal/ liquid consistency.      Review of record shows patient OT has completed interventions to help prevent further contracture of her right elbow. Nursing staff applies right elbow splint twice daily.   Patient has not exhibited any signs or symptoms of hypothyroidism, recent lab satisfactory.  Antidepressant medicine was tapered and stopped February 2014, was restarted in March 2014 due to symptoms of depression. The patient is tolerating this medication, no report of depressive symptoms.   Last visit:  Other and unspecified hyperlipidemia  Lipid levels remain satisfactory, no medication required.  Depressive disorder, not elsewhere classified  Celexa restarted 3 2014. No recent report of any signs or symptoms of depression. Continue medication. Check electrolytes.   Other degenerative diseases of the basal ganglia  01/2013 MDS reviewed. Patient is unable to participate in BIMS or PHQ-9. She continues to require extensive assistance for all activities. Transfers with [employer]. Is essentially nonverbal but does communicate in nonverbal manner. Continues to attend activities. Family remains involved, visits often.  Unspecified hypothyroidism  No signs or symptoms of hypothyroidism, most recent TSH satisfactory. Continue current supplement.   Functional Status Bathing: Dependent Bed Mobility: Maximal Assist Bladder Management: Diapers / Pads, Bowel Management: diapers. Pads Feeding: Dependent, Diet: Puree, Nectar thick liquids Hygiene and Grooming: Maximal Assist, Toileting / Clothing: Maximal Assist  Transfers: Dependent, Wheelchair: Dependent   No Known Allergies Medications  reviewed   DATA REVIEWED  Radiologic Exams:   [facility] x-ray 07/02/2012 x-ray right ankle, comparison 6 2012. Old posttraumatic changes in the medial malleolus are present. There is now 1 mm chip fracture the medial aspect of the medial malleolus not noted on previous survey of 03/10/2011.  X-ray right foot, no comparison. No definite acute bony abnormalities the right foot can be identified  08/03/2012 X-ray of right ankle AP, lateral and oblique views, compared to 07/02/2012: Small ununited 1 mm chip fracture medial malleolus difficult to date with associated regularity of medial malleolar area again difficult to date. Excellent position and alignment of the medial malleolus is seen with no findings present to suggest acute fracture or dislocation  Cardiovascular Exams:   Laboratory Studies:  [facility] 11/22/11 WBC 6.4, Hgb 13.9, Hct 40.6, Plt 237 Glu 91, BUN 13, Cr. .78, na 143, K+4.0. LFTs WNL. Prot 5.6, alb 3.3 TC 169, TG 168, HDL 25, LDL 110 TSH 5.62 A1C 5.5 06/28/12 Glu 75, BUN 20,  Cr. .74, Na 140, K+ 4.1 TC 160, TG 131, HDL 28, LDL 106  01/01/2013 WBC  6.7, hemoglobin 14.6, hematocrit 42.3, platelets 227  Glucose 82, BUN 24, creatinine 0.77, sodium 140, potassium 4.2. LFTs/proteins WNL.  Total cholesterol 239, triglyceride 155, HDL 33, LDL 175   TSH 3.18 04/23/2013 glucose 93, BUN 23, creatinine 0.81, sodium 139, potassium 3.9      Past Medical History  Diagnosis Date  . Other and unspecified hyperlipidemia 04/22/2013  . Depressive disorder, not elsewhere classified 04/22/2013  . Other degenerative diseases of the basal ganglia 04/22/2013  . Aphasia 04/22/2013    Re: PSP   . Encounter for long-term (current) use of other medications 04/22/2013  . Anxiety state, unspecified 2011  . Chronic diastolic heart failure 2011    Grade1 by Echocardiogram  . Closed fracture of medial malleolus 07/2012    chip fracture of right medial malleolus 10 2013  . Closed fracture of one rib 2011    Left 10th rib 12/2009  . Dysphagia, oropharyngeal phase 2012  . Full incontinence of feces 2013    re: PSP  . Unspecified essential hypertension 2011  . Unspecified hypothyroidism 2011  . Unspecified urinary incontinence 2013    re: PSP  . Personal history of malignant neoplasm of breast 2006    Status post lumpectomy radiation 2006  . 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 Toxic effect of inorganic lead compounds 2012  . Dizziness and giddiness 2011  . Other B-complex deficiencies 2013  . Mild vitamin D deficiency 2012  . 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 Orif rt. elbow Right 2001  . Breast lumpectomy  2006   Family Status  Relation Status Death Age  . Father Deceased 56    Emphysema  . Mother Deceased 80s    Suicide  . Son Alive   . Daughter Alive    History   Social History Narrative   Patient is Widowed.   Lives in Skilled nursing  section at [facility] since 11/2011.   No Smoking history  No sig. Alcohol history   Previously very physically active,  marathon runner until age 64.   Patient has  Advanced planning documents:  DNR                Review of Systems  DATA OBTAINED: from  nurse, medical record, GENERAL:  No fevers, fatigue, change in appetite or weight SKIN: No itch, rash or open wounds EYES: No eye pain, dryness or itching  EARS: No earache, change in hearing NOSE: No congestion, drainage or bleeding MOUTH/THROAT: No mouth or tooth pain  No sore throat   No difficulty swallowing pureed diet RESPIRATORY: No cough, wheezing, SOB CARDIAC: No chest pain,  No edema. GI: No abdominal pain  No N/V/D or constipation  Incontinent  GU: No dysuria, frequency or urgency  No change in urine volume or character Incontinent  MUSCULOSKELETAL: No joint pain, swelling   No back pain  Not ambulatory   No recent falls.  NEUROLOGIC: No dizziness, fainting, headache, No change in mental status, limited interactions.   Physical Exam Filed Vitals:   07/15/13 1440  BP: 142/89  Pulse: 79  Temp: 98.7 F (37.1 C)  Resp: 18  Weight: 141 lb (63.957 kg)  SpO2: 94%   Body mass index is 23.46 kg/(m^2).  GENERAL APPEARANCE: No acute distress, appropriately groomed, normal body habitus. Alert, pleasant,  nonverbal  SKIN: No diaphoresis, rash, unusual lesions, wounds HEAD: Normocephalic, atraumatic EYES: Conjunctiva/lids clear. Pupils round, reactive. Marland Kitchen  EARS: External exam WNL, unable to assess hearing NOSE: No deformity or discharge. MOUTH/THROAT: Lips w/o lesions. Will not open mouth today   NECK:  No thyroid tenderness, enlargement or nodule LYMPHATICS: No head, neck or supraclavicular adenopathy RESPIRATORY: Breathing is even, unlabored. Lung sounds are clear and full.  CARDIOVASCULAR: Heart RRR. No murmur or extra heart sounds  ARTERIAL: No carotid bruit.   VENOUS: No varicosities. No venous stasis skin changes  EDEMA: No peripheral edema.  GASTROINTESTINAL: Abdomen is soft, non-tender, not distended w/ normal bowel sounds.   MUSCULOSKELETAL: No spontaneous movement. Passive movement of joints is easy except right elbow this is stiff and will not extend fully. Back is without kyphosis, scoliosis or spinal process tenderness.  NEUROLOGIC: Unable to assess orientation, patient is nonverbal , no tremor.Marland Kitchen PSYCHIATRIC: Mood and affect appropriate to situation   ASSESSMENT/PLAN  Other degenerative diseases of the basal ganglia Further decline re: PSP as evidenced by swallow dysfunction, decreased interactions. Daughter has previously requested focus of care be comfort. Recommend SW initiate discussion re: MOST form at next Care Plan. Continue supportive care  Depressive disorder, not elsewhere classified Tolerating medication w/o adverse effects, difficult to measure if effective; pt. Is less interactive/ communicative in general.  Continue medication for now   Dysphagia, oropharyngeal Re: PSP. Diet changed to Pureed recently. Pt. Is tolerating well, appears adequately hydrated.   Unspecified hypothyroidism Unchanged, continue current supplement.    Follow up: Routine or as needed  Jamal Haskin T.Vashon Arch, NP-C 07/15/2013

## 2013-09-02 ENCOUNTER — Encounter: Payer: Self-pay | Admitting: Geriatric Medicine

## 2013-09-02 ENCOUNTER — Non-Acute Institutional Stay (SKILLED_NURSING_FACILITY): Payer: Medicare Other | Admitting: Geriatric Medicine

## 2013-09-02 DIAGNOSIS — Z7189 Other specified counseling: Secondary | ICD-10-CM

## 2013-09-02 DIAGNOSIS — G238 Other specified degenerative diseases of basal ganglia: Secondary | ICD-10-CM

## 2013-09-02 DIAGNOSIS — R1312 Dysphagia, oropharyngeal phase: Secondary | ICD-10-CM

## 2013-09-02 DIAGNOSIS — E039 Hypothyroidism, unspecified: Secondary | ICD-10-CM

## 2013-09-02 HISTORY — DX: Other specified counseling: Z71.89

## 2013-09-02 NOTE — Progress Notes (Signed)
Patient ID: Jessica Hinton, female   DOB: 1931-07-20, 79 y.o.   MRN: [id number] [facility] [phone])  Code Status: DNR  Contact Information   Name Relation Home Work Mobile   Hinton,Jessica Daughter [phone]        Chief Complaint  Patient presents with  . Medical Managment of Chronic Issues    HPI: This is a 79 y.o. female resident of [facility], Skilled Nursing section evaluated today for management of ongoing medical issues.   Last visit:  Other degenerative diseases of the basal ganglia Further decline re: PSP as evidenced by swallow dysfunction, decreased interactions. Daughter has previously requested focus of care be comfort. Recommend SW initiate discussion re: MOST form at next Care Plan. Continue supportive care  Depressive disorder, not elsewhere classified Tolerating medication w/o adverse effects, difficult to measure if effective; pt. Is less interactive/ communicative in general.  Continue medication for now   Dysphagia, oropharyngeal Re: PSP. Diet changed to Pureed recently. Pt. Is tolerating well, appears adequately hydrated.   Unspecified hypothyroidism Unchanged, continue current supplement.   Since last visit, patient has remained stable; no acute medical issues. She is tolerating pureed diet, no choking episodes, weight/hydration status adequate. Daughter has requested discontinuation of Synthroid unless there is a compelling reason to continue it. We have discussed goals of care today and completed a MOST form. Goal is comfort with no life prolonging treatments. No hospitalization unless comfort needs cannot be met in facility, antibiotics for comfort, No IVF, No tube feeding.  Functional Status Bathing: Dependent Bed Mobility: Maximal Assist Bladder Management: Diapers / Pads, Bowel Management: diapers. Pads Feeding: Dependent, Diet: Puree, Nectar thick liquids Hygiene and Grooming: Maximal Assist, Toileting /  Clothing: Maximal Assist  Transfers: Dependent, Wheelchair: Dependent   No Known Allergies  Medications  reviewed   DATA REVIEWED  Radiologic Exams:   [facility] x-ray 07/02/2012 x-ray right ankle, comparison 6 2012. Old posttraumatic changes in the medial malleolus are present. There is now 1 mm chip fracture the medial aspect of the medial malleolus not noted on previous survey of 03/10/2011.  X-ray right foot, no comparison. No definite acute bony abnormalities the right foot can be identified  08/03/2012 X-ray of right ankle AP, lateral and oblique views, compared to 07/02/2012: Small ununited 1 mm chip fracture medial malleolus difficult to date with associated regularity of medial malleolar area again difficult to date. Excellent position and alignment of the medial malleolus is seen with no findings present to suggest acute fracture or dislocation  Cardiovascular Exams:   Laboratory Studies:  [facility]  01/01/2013 WBC 6.7, hemoglobin 14.6, hematocrit 42.3, platelets 227  Glucose 82, BUN 24, creatinine 0.77, sodium 140, potassium 4.2. LFTs/proteins WNL.  Total cholesterol 239, triglyceride 155, HDL 33, LDL 175   TSH 3.18 04/23/2013 glucose 93, BUN 23, creatinine 0.81, sodium 139, potassium 3.9      Review of Systems  DATA OBTAINED: from  nurse, medical record, GENERAL:  No fevers, fatigue, change in appetite or weight SKIN: No itch, rash or open wounds EYES: No eye pain or itching  EARS: No earache,  NOSE: No congestion, drainage or bleeding MOUTH/THROAT: No mouth or tooth pain   No difficulty swallowing pureed diet RESPIRATORY: No cough, wheezing, SOB CARDIAC: No chest pain,  No edema. GI: No abdominal pain  No N/V/D or constipation  Incontinent  GU: No dysuria, frequency  No change in urine volume or character Incontinent  MUSCULOSKELETAL: No joint pain, swelling  No back pain  Not ambulatory   No recent falls.  NEUROLOGIC: No dizziness, fainting, headache, No  change in mental status, limited interactions.   Physical Exam Filed Vitals:   09/02/13 1424  BP: 129/77  Pulse: 69  Temp: 97.2 F (36.2 C)  Resp: 16  Weight: 142 lb 9.6 oz (64.683 kg)  SpO2: 94%   Body mass index is 23.73 kg/(m^2).  GENERAL APPEARANCE: No acute distress, appropriately groomed, normal body habitus. Alert, pleasant, nonverbal  SKIN: No diaphoresis, rash, unusual lesions, wounds HEAD: Normocephalic, atraumatic EYES: Conjunctiva/lids clear.  EARS: External exam WNL, unable to assess hearing NOSE: No deformity or discharge. MOUTH/THROAT: Lips w/o lesions. Will not open mouth today   NECK:  No thyroid tenderness, enlargement or nodule LYMPHATICS: No head, neck or supraclavicular adenopathy RESPIRATORY: Breathing is even, unlabored. Lung sounds are clear and full.  CARDIOVASCULAR: Heart RRR. No murmur or extra heart sounds   EDEMA: No peripheral edema.  GASTROINTESTINAL: Abdomen is soft, non-tender, not distended w/ normal bowel sounds.  MUSCULOSKELETAL: No spontaneous movement. Passive movement of joints is easy except right elbow this is stiff and will not extend fully. Back is without kyphosis, scoliosis or spinal process tenderness.  NEUROLOGIC: Unable to assess orientation, patient is nonverbal , no tremor.Marland Kitchen PSYCHIATRIC: Mood and affect appropriate to situation   ASSESSMENT/PLAN  Dysphagia, oropharyngeal Unchanged, continue pureed diet  Unspecified hypothyroidism Unclear if supplement is adding to quality of life, Stop thyroid supplement, monitor for s/s hypothyroidism  Other degenerative diseases of the basal ganglia No change since last visit, nonverbal, limited interactions. Tolerating pureed diet. MOST form completed  Advanced care planning/counseling discussion Goals of care discussion with patient's daughter, Jessica Hinton today. She has recently re-read her Mom's Living Will which states no life prolonging measures if diagnosis is terminal. Patient's  main  diagnosis is a progressive degenerative neurological disease, probably PSP. Her current condition is stable though she is non verbal, has minimal activity, requires modified diet (pureed) due to poor swallow function.  We reviewed the MOST form and the following decisions were recorded: [facility] hospitalization unless comfort needs cannot be met in facility, may use antibiotics to provide comfort, No IVF, No tube feeding.  Time: , >50% spent counseling/or care coordination  Follow up: Routine or as needed  Coye Dawood T.Sherlin Sonier, NP-C 09/02/2013

## 2013-09-02 NOTE — Assessment & Plan Note (Signed)
Unclear if supplement is adding to quality of life, Stop thyroid supplement, monitor for s/s hypothyroidism

## 2013-09-02 NOTE — Assessment & Plan Note (Addendum)
Goals of care discussion with patient's daughter, Jessica Hinton today. She has recently re-read her Mom's Living Will which states no life prolonging measures if diagnosis is terminal. Patient's  main diagnosis is a progressive degenerative neurological disease, probably PSP. Her current condition is stable though she is non verbal, has minimal activity, requires modified diet (pureed) due to poor swallow function.  We reviewed the MOST form and the following decisions were recorded: [facility] hospitalization unless comfort needs cannot be met in facility, may use antibiotics to provide comfort, No IVF, No tube feeding.

## 2013-09-02 NOTE — Assessment & Plan Note (Signed)
No change since last visit, nonverbal, limited interactions. Tolerating pureed diet. MOST form completed

## 2013-09-02 NOTE — Assessment & Plan Note (Signed)
Unchanged, continue pureed diet

## 2013-10-28 ENCOUNTER — Non-Acute Institutional Stay (SKILLED_NURSING_FACILITY): Payer: Medicare Other | Admitting: Geriatric Medicine

## 2013-10-28 ENCOUNTER — Encounter: Payer: Self-pay | Admitting: Geriatric Medicine

## 2013-10-28 DIAGNOSIS — R1312 Dysphagia, oropharyngeal phase: Secondary | ICD-10-CM

## 2013-10-28 DIAGNOSIS — G238 Other specified degenerative diseases of basal ganglia: Secondary | ICD-10-CM

## 2013-10-28 NOTE — Assessment & Plan Note (Signed)
Patient continues to consume pured diet without signs of choking. Patient has exhibited some weight loss over the last few months despite adequate intake

## 2013-10-28 NOTE — Progress Notes (Signed)
Patient ID: Jessica Hinton, female   DOB: 1930-10-15, 80 y.o.   MRN: [id number] [facility] (31)  Code Status: DNR, MOST form  Contact Information   Name Relation Home Work [place] Daughter [phone]        Chief Complaint  Patient presents with  . Medical Managment of Chronic Issues    HPI: This is a 80 y.o. female resident of [facility], Skilled Nursing section evaluated today for management of ongoing medical issues.    Last visit:  Dysphagia, oropharyngeal Unchanged, continue pureed diet  Unspecified hypothyroidism Unclear if supplement is adding to quality of life, Stop thyroid supplement, monitor for s/s hypothyroidism  Other degenerative diseases of the basal ganglia No change since last visit, nonverbal, limited interactions. Tolerating pureed diet. MOST form completed  Advanced care planning/counseling discussion Goals of care discussion with patient's daughter, [name] today. She has recently re-read her Mom's Living Will which states no life prolonging measures if diagnosis is terminal. Patient's  main diagnosis is a progressive degenerative neurological disease, probably PSP. Her current condition is stable though she is non verbal, has minimal activity, requires modified diet (pureed) due to poor swallow function.  We reviewed the MOST form and the following decisions were recorded: [facility] hospitalization unless comfort needs cannot be met in facility, may use antibiotics to provide comfort, No IVF, No tube feeding.  Since last visit patient hasn't experienced any acute medical issues. Review of facility record shows patient's vital signs have been stable,  she has lost 7 pounds in the last 3 months despite p.o. intake greater than 75% at most meals. She continues to be involved to assure patient's wheelchair is functioning appropriately.  Functional Status Bathing: Dependent Bed Mobility: Maximal  Assist Bladder Management: Diapers / Pads, Bowel Management: diapers. Pads Feeding: Dependent, Diet: Puree, Nectar thick liquids Hygiene and Grooming: Maximal Assist, Toileting / Clothing: Maximal Assist  Transfers: Dependent, Wheelchair: Dependent   No Known Allergies  MEDICATION -  reviewed   DATA REVIEWED  Radiologic Exams:   [facility] x-ray 07/02/2012 x-ray right ankle, comparison 6 2012. Old posttraumatic changes in the medial malleolus are present. There is now 1 mm chip fracture the medial aspect of the medial malleolus not noted on previous survey of 03/10/2011.  X-ray right foot, no comparison. No definite acute bony abnormalities the right foot can be identified  08/03/2012 X-ray of right ankle AP, lateral and oblique views, compared to 07/02/2012: Small ununited 1 mm chip fracture medial malleolus difficult to date with associated regularity of medial malleolar area again difficult to date. Excellent position and alignment of the medial malleolus is seen with no findings present to suggest acute fracture or dislocation  Cardiovascular Exams:   Laboratory Studies:  [facility]  01/01/2013 WBC 6.7, hemoglobin 14.6, hematocrit 42.3, platelets 227  Glucose 82, BUN 24, creatinine 0.77, sodium 140, potassium 4.2. LFTs/proteins WNL.  Total cholesterol 239, triglyceride 155, HDL 33, LDL 175   TSH 3.18 04/23/2013 glucose 93, BUN 23, creatinine 0.81, sodium 139, potassium 3.9      REVIEW OF SYSTEMS  DATA OBTAINED: from  nurse, medical record, GENERAL:  No fevers, fatigue, change in appetite. Weight loss present SKIN: No itch, rash or open wounds EYES: No eye pain or itching  EARS: No earache,  NOSE: No congestion, drainage or bleeding MOUTH/THROAT: No mouth or tooth pain   No difficulty swallowing pureed diet RESPIRATORY: No cough, wheezing, SOB CARDIAC: No chest pain,  No  edema. GI: No abdominal pain  No N/V/D or constipation  Incontinent  GU: No dysuria, frequency   No change in urine volume or character Incontinent  MUSCULOSKELETAL: No joint pain, swelling   No back pain  Not ambulatory   No recent falls.  NEUROLOGIC: No dizziness, fainting, headache, No change in mental status, limited interactions.   PHYSICAL EXAM  Filed Vitals:   10/28/13 1620  BP: 109/59  Pulse: 71  Temp: 96.2 F (35.7 C)  Resp: 16  Weight: 135 lb 12.8 oz (61.598 kg)  SpO2: 96%   Body mass index is 22.6 kg/(m^2).  GENERAL APPEARANCE: No acute distress, appropriately groomed, normal body habitus. Alert, pleasant, nonverbal, very slow response to even simple commands SKIN: No diaphoresis, rash, unusual lesions, wounds HEAD: Normocephalic, atraumatic EYES: Conjunctiva clear. Left lower eyelid with a small amount of debris in eyelashes EARS: External exam WNL, unable to assess hearing NOSE: No deformity or discharge. MOUTH/THROAT: Lips w/o lesions. Oral mucosa,, tongue clear, moist  NECK:  No thyroid tenderness, enlargement or nodule LYMPHATICS: No head, neck or supraclavicular adenopathy RESPIRATORY: Breathing is even, unlabored. Lung sounds are clear and full.  CARDIOVASCULAR: Heart RRR. No murmur or extra heart sounds   EDEMA: No peripheral edema.  GASTROINTESTINAL: Abdomen is soft, non-tender, not distended w/ normal bowel sounds.  MUSCULOSKELETAL: No spontaneous movement. Passive movement of LE joints is stiff,  UE are crossed over her chest, unwilling to extend today.  NEUROLOGIC: Unable to assess orientation, patient is nonverbal , no tremor.Marland Kitchen PSYCHIATRIC: Mood and affect appropriate to situation   ASSESSMENT/PLAN  Other degenerative diseases of the basal ganglia Essentially unchanged from prior visit except weight loss. This likely is due to progressive neurodegenerative decline. Goal of care is comfort not life prolongation. Continue to offer food every meal, monitor intake/ monthly weight  Dysphagia, oropharyngeal Patient continues to consume pured diet  without signs of choking. Patient has exhibited some weight loss over the last few months despite adequate intake   Follow up: Routine or as needed  Izaiyah Kleinman T.Dominyck Reser, NP-C 10/28/2013

## 2013-10-28 NOTE — Assessment & Plan Note (Signed)
Essentially unchanged from prior visit except weight loss. This likely is due to progressive neurodegenerative decline. Goal of care is comfort not life prolongation. Continue to offer food every meal, monitor intake/ monthly weight

## 2014-01-24 ENCOUNTER — Non-Acute Institutional Stay (SKILLED_NURSING_FACILITY): Payer: Medicare Other

## 2014-01-24 DIAGNOSIS — G238 Other specified degenerative diseases of basal ganglia: Secondary | ICD-10-CM

## 2014-01-24 DIAGNOSIS — R1312 Dysphagia, oropharyngeal phase: Secondary | ICD-10-CM

## 2014-01-24 NOTE — Assessment & Plan Note (Signed)
This patient continues slow decline with progressive swallowing disorder and weight loss despite adequate p.o. Intake.

## 2014-01-24 NOTE — Assessment & Plan Note (Signed)
Further decline in swallowing function; patient now eating baby food consistency. Diet and nectar thickened liquids. She appears to be eating greater than 75% each meal fluid intake is adequate as well. No recent report of coughing or choking with meals

## 2014-01-24 NOTE — Progress Notes (Signed)
Patient ID: Jessica Hinton, female   DOB: 12/06/1930, 80 y.o.   MRN: [id number]  [facility] (31)  Code Status: DNR, MOST form  Contact Information   Name Relation Home Work Mobile   [name],[name] Daughter [phone]        No chief complaint on file.   HPI: This is a 80 y.o. female resident of [facility], Skilled Nursing section evaluated today for management of ongoing medical issues.    Last visit:  Other degenerative diseases of the basal ganglia Essentially unchanged from prior visit except weight loss. This likely is due to progressive neurodegenerative decline. Goal of care is comfort not life prolongation. Continue to offer food every meal, monitor intake/ monthly weight Dysphagia, oropharyngeal Patient continues to consume pured diet without signs of choking. Patient has exhibited some weight loss over the last few months despite adequate intake   Since last visit no acute medical issues. Last month noted with coughing and meals, ST reevaluated, further modifications need to diet consistency. Review of facility record shows vital signs stable, continued weight loss. She has lost 7 pounds since last visit despite adequate p.o. Intake.   No Known Allergies  MEDICATION -  reviewed   DATA REVIEWED  Radiologic Exams:   [facility] x-ray  Cardiovascular Exams:   Laboratory Studies:  [facility]  01/01/2013 WBC 6.7, hemoglobin 14.6, hematocrit 42.3, platelets 227  Glucose 82, BUN 24, creatinine 0.77, sodium 140, potassium 4.2. LFTs/proteins WNL.  Total cholesterol 239, triglyceride 155, HDL 33, LDL 175   TSH 3.18 04/23/2013 glucose 93, BUN 23, creatinine 0.81, sodium 139, potassium 3.9      REVIEW OF SYSTEMS  DATA OBTAINED: from  nurse, medical record, GENERAL:  No fevers, fatigue, change in appetite. Weight loss present SKIN: No itch, rash or open wounds EYES: No sign of eye pain or itching  EARS: No sign of  earache,  NOSE: No congestion, drainage or bleeding MOUTH/THROAT: No sign of mouth or tooth pain   No difficulty swallowing pureed diet. Thickened liquids RESPIRATORY: No cough, wheezing, SOB CARDIAC: No sign of chest pain,  No edema. GI: No abdominal pain  No N/V/D or constipation  Incontinent  GU: No dysuria, frequency  No change in urine volume or character Incontinent  MUSCULOSKELETAL: No sign of joint or back pain, No swelling    Not ambulatory   No recent falls.  NEUROLOGIC:  No change in mental status, limited interactions.   PHYSICAL EXAM  Filed Vitals:   01/24/14 1412  BP: 112/72  Pulse: 69  Temp: 97.6 F (36.4 C)  Resp: 14  Weight: 128 lb (58.06 kg)  SpO2: 95%   Body mass index is 21.3 kg/(m^2).  GENERAL APPEARANCE: No acute distress, appropriately groomed, normal body habitus. Alert, pleasant, nonverbal, unable to follow simple commands today  SKIN: No diaphoresis, rash, unusual lesions, wounds HEAD: Normocephalic, atraumatic EYES: Conjunctiva clear.  EARS: External exam WNL, unable to assess hearing NOSE: No deformity or discharge. MOUTH/THROAT: Lips w/o lesions. Oral mucosa,, tongue clear, moist  NECK:  No thyroid tenderness, enlargement or nodule LYMPHATICS: No head, neck or supraclavicular adenopathy RESPIRATORY: Breathing is even, unlabored. Lung sounds are clear and full.  CARDIOVASCULAR: Heart RRR. No murmur or extra heart sounds   EDEMA: No peripheral edema.  GASTROINTESTINAL: Abdomen is soft, non-tender, not distended w/ normal bowel sounds.  MUSCULOSKELETAL: No spontaneous movement. Passive movement of left lower extremity is smooth and easy, right knee and hip are very stiff.  Right upper extremity with contracture at the elbow (90), left arm extends and flexes passively easily NEUROLOGIC: Unable to assess orientation, patient is nonverbal , no tremor.Marland Kitchen. PSYCHIATRIC: Mood and affect appropriate to situation   ASSESSMENT/PLAN  Dysphagia,  oropharyngeal Further decline in swallowing function; patient now eating baby food consistency. Diet and nectar thickened liquids. She appears to be eating greater than 75% each meal fluid intake is adequate as well. No recent report of coughing or choking with meals  Other degenerative diseases of the basal ganglia This patient continues slow decline with progressive swallowing disorder and weight loss despite adequate p.o. Intake.    Follow up: Return for Routine/as needed.   Toribio Harbourlaudette T.Tais Koestner, NP-C [facility] [phone]  01/24/2014

## 2014-03-25 ENCOUNTER — Non-Acute Institutional Stay (SKILLED_NURSING_FACILITY): Payer: Medicare Other | Admitting: Nurse Practitioner

## 2014-03-25 DIAGNOSIS — F3289 Other specified depressive episodes: Secondary | ICD-10-CM

## 2014-03-25 DIAGNOSIS — F329 Major depressive disorder, single episode, unspecified: Secondary | ICD-10-CM

## 2014-03-25 DIAGNOSIS — G238 Other specified degenerative diseases of basal ganglia: Secondary | ICD-10-CM

## 2014-03-25 DIAGNOSIS — R1312 Dysphagia, oropharyngeal phase: Secondary | ICD-10-CM

## 2014-03-25 NOTE — Progress Notes (Signed)
Patient ID: Jessica BoyersManitta Hinton, female   DOB: 06/14/1931, 80 y.o.   MRN: [id number]    Nursing Home Location:  [facility]   Place of Service: SNF (31)  PCP: Kimber RelicGREEN, ARTHUR G, MD  No Known Allergies  Chief Complaint  Patient presents with  . Medical Management of Chronic Issues    HPI:  HPI: This is a 80 y.o. female resident of [facility], Skilled Nursing section evaluated today for management of ongoing medical issues.  conts on pureed diet. No signs of choking or aspiration. Since last visit no acute medical issues.    Review of Systems:  Unable to obtain   Past Medical History  Diagnosis Date  . Other and unspecified hyperlipidemia 04/22/2013  . Depressive disorder, not elsewhere classified 04/22/2013  . Other degenerative diseases of the basal ganglia 04/22/2013  . Aphasia 04/22/2013    Re: PSP   . Encounter for long-term (current) use of other medications 04/22/2013  . Anxiety state, unspecified 2011  . Chronic diastolic heart failure 2011    Grade1 by Echocardiogram  . Closed fracture of medial malleolus 07/2012    chip fracture of right medial malleolus 10 2013  . Closed fracture of one rib 2011    Left 10th rib 12/2009  . Dysphagia, oropharyngeal phase 2012  . Full incontinence of feces 2013    re: PSP  . Unspecified essential hypertension 2011  . Unspecified hypothyroidism 2011  . Unspecified urinary incontinence 2013    re: PSP  . Personal history of malignant neoplasm of breast 2006    Status post lumpectomy radiation 2006  . 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 Toxic effect of inorganic lead compounds 2012  . Dizziness and giddiness 2011  . Other B-complex deficiencies 2013  . Mild vitamin D deficiency 2012  . Contracture of elbow joint 04/22/2013  . Advanced care planning/counseling discussion 09/02/2013    MOST form 09/02/2013   Past Surgical History  Procedure Laterality Date  . Orif rt. elbow Right 2001  .  Breast lumpectomy  2006   Social History:   reports that she has never smoked. She does not have any smokeless tobacco history on file. Her alcohol and drug histories are not on file.  No family history on file.  Medications: Patient's Medications  New Prescriptions   No medications on file  Previous Medications   ACETAMINOPHEN (TYLENOL) 325 MG TABLET    Take 650 mg by mouth 2 (two) times daily.   ARTIFICIAL SALIVA (BIOTENE MOISTURIZING MOUTH) SOLN    Use as directed 1 spray in the mouth or throat 4 (four) times daily.   CITALOPRAM (CELEXA) 20 MG TABLET    Take 20 mg by mouth daily.  Modified Medications   No medications on file  Discontinued Medications   No medications on file     Physical Exam: GENERAL APPEARANCE: No acute distress, appropriately groomed, normal body habitus. Alert, pleasant, nonverbal, unable to follow simple commands today  SKIN: No diaphoresis, rash, unusual lesions, wounds HEAD: Normocephalic, atraumatic EYES: Conjunctiva clear.  EARS: External exam WNL, unable to assess hearing NOSE: No deformity or discharge. MOUTH/THROAT: Lips w/o lesions. Oral mucosa,, tongue clear, moist   RESPIRATORY: Breathing is even, unlabored. Lung sounds are CTA CARDIOVASCULAR: Heart RRR. No murmur or extra heart sounds                         EDEMA: No peripheral edema.  GASTROINTESTINAL: Abdomen is soft, non-tender, not distended w/ normal bowel sounds.   MUSCULOSKELETAL: sits in WC, no signs of pain NEUROLOGIC: Unable to assess orientation, patient is nonverbal , no tremor.PSYCHIATRIC: flat affect, does not respond during exam  Filed Vitals:   03/25/14 1013  BP: 111/64  Pulse: 65  Temp: 97.8 F (36.6 C)  Resp: 20  Weight: 122 lb 9.6 oz (55.611 kg)  SpO2: 97%      Labs reviewed: Basic Metabolic Panel:  Recent Labs  16/06/9607/05/14  NA 139  K 3.9  BUN 23*  CREATININE 0.8   (no further lab work per family request)   Assessment/Plan 1. Other degenerative  diseases of the basal ganglia Unchanged, comfort care only  2. Dysphagia, oropharyngeal -without signs of aspiration   3. Depressive disorder, not elsewhere classified Remains stable

## 2014-06-10 ENCOUNTER — Non-Acute Institutional Stay (SKILLED_NURSING_FACILITY): Payer: Medicare Other | Admitting: Nurse Practitioner

## 2014-06-10 DIAGNOSIS — F3289 Other specified depressive episodes: Secondary | ICD-10-CM

## 2014-06-10 DIAGNOSIS — R4701 Aphasia: Secondary | ICD-10-CM

## 2014-06-10 DIAGNOSIS — F329 Major depressive disorder, single episode, unspecified: Secondary | ICD-10-CM

## 2014-06-10 DIAGNOSIS — R1312 Dysphagia, oropharyngeal phase: Secondary | ICD-10-CM

## 2014-06-10 DIAGNOSIS — M2450 Contracture, unspecified joint: Secondary | ICD-10-CM

## 2014-06-10 DIAGNOSIS — G238 Other specified degenerative diseases of basal ganglia: Secondary | ICD-10-CM

## 2014-06-10 NOTE — Progress Notes (Signed)
Patient ID: Jessica Hinton, female   DOB: Apr 30, 1931, 80 y.o.   MRN: [id number]    Nursing Home Location:  [facility]   Place of Service: SNF (31)  PCP: Kimber Relic, MD  No Known Allergies  Chief Complaint  Patient presents with  . Medical Management of Chronic Issues    HPI:  HPI: This is a 80 y.o. female resident of [facility], Skilled Nursing section evaluated today for management of ongoing medical issues.  pt was seen for increased contractures in elbows bilaterally and placed on baclofen. Staff does not notice a difference. Pt on strained pureed diet without signs of choking or aspiration. Since last visit no acute medical issues.  Review of Systems:  Unable to obtain pt is a aphagic   Past Medical History  Diagnosis Date  . Other and unspecified hyperlipidemia 04/22/2013  . Depressive disorder, not elsewhere classified 04/22/2013  . Other degenerative diseases of the basal ganglia 04/22/2013  . Aphasia 04/22/2013    Re: PSP   . Encounter for long-term (current) use of other medications 04/22/2013  . Anxiety state, unspecified 2011  . Chronic diastolic heart failure 2011    Grade1 by Echocardiogram  . Closed fracture of medial malleolus 07/2012    chip fracture of right medial malleolus 10 2013  . Closed fracture of one rib 2011    Left 10th rib 12/2009  . Dysphagia, oropharyngeal phase 2012  . Full incontinence of feces 2013    re: PSP  . Unspecified essential hypertension 2011  . Unspecified hypothyroidism 2011  . Unspecified urinary incontinence 2013    re: PSP  . Personal history of malignant neoplasm of breast 2006    Status post lumpectomy radiation 2006  . 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 Toxic effect of inorganic lead compounds 2012  . Dizziness and giddiness 2011  . Other B-complex deficiencies 2013  . Mild vitamin D deficiency 2012  . Contracture of elbow joint 04/22/2013  . Advanced care  planning/counseling discussion 09/02/2013    MOST form 09/02/2013   Past Surgical History  Procedure Laterality Date  . Orif rt. elbow Right 2001  . Breast lumpectomy  2006   Social History:   reports that she has never smoked. She does not have any smokeless tobacco history on file. Her alcohol and drug histories are not on file.  No family history on file.  Medications: Patient's Medications  New Prescriptions   No medications on file  Previous Medications   ACETAMINOPHEN (TYLENOL) 325 MG TABLET    Take 650 mg by mouth 2 (two) times daily.   ARTIFICIAL SALIVA (BIOTENE MOISTURIZING MOUTH) SOLN    Use as directed 1 spray in the mouth or throat 4 (four) times daily.   BACLOFEN (LIORESAL) 10 MG TABLET    Take 10 mg by mouth 3 (three) times daily.   CITALOPRAM (CELEXA) 20 MG TABLET    Take 20 mg by mouth daily.  Modified Medications   No medications on file  Discontinued Medications   No medications on file     Physical Exam: GENERAL APPEARANCE: No acute distress, appropriately groomed, normal body habitus. Alert, pleasant, nonverbal, unable to follow simple commands today  SKIN: No diaphoresis, rash, unusual lesions, wounds HEAD: Normocephalic, atraumatic EYES: Conjunctiva clear.  EARS: External exam WNL NOSE: No deformity or discharge. MOUTH/THROAT: Lips w/o lesions. Oral mucosa,, tongue clear, moist   RESPIRATORY: Breathing is even, unlabored. Lung sounds are CTA CARDIOVASCULAR:  Heart RRR. No murmur or extra heart sounds                         EDEMA: No peripheral edema.   GASTROINTESTINAL: Abdomen is soft, non-tender, not distended w/ normal bowel sounds.   MUSCULOSKELETAL: sits in WC, no signs of pain, stiff movement to bilateral UE NEUROLOGIC: Unable to assess orientation, patient is nonverbal , no tremor PSYCHIATRIC: flat affect, does not respond during exam  Filed Vitals:   06/10/14 0953  BP: 112/68  Pulse: 80  Resp: 14      Labs reviewed:  (no further  lab work per family request)   Assessment/Plan  1. Depressive disorder, not elsewhere classified -conts on SSRI, unable to truly assess depression due to aphasia however due to current condition will leave her on current medication   2. Other degenerative diseases of the basal ganglia Advanced, requiring total care. Family request comfort measures only. No additional lab work or hospitalizations  3. Contracted, joint -baclofen 10 mg TID added, nursing reports minimal changes at times   4. Aphasia unchanged  5. Dysphagia, oropharyngeal -eating all on her plate at meal times, weight loss noted, spoke with nursing will start double portion . Now on strained puree food;  No signs of aspiration

## 2014-07-23 ENCOUNTER — Encounter: Payer: Self-pay | Admitting: Nurse Practitioner

## 2014-07-23 ENCOUNTER — Non-Acute Institutional Stay (SKILLED_NURSING_FACILITY): Payer: Medicare Other | Admitting: Nurse Practitioner

## 2014-07-23 DIAGNOSIS — M24549 Contracture, unspecified hand: Secondary | ICD-10-CM | POA: Insufficient documentation

## 2014-07-23 DIAGNOSIS — M24542 Contracture, left hand: Secondary | ICD-10-CM

## 2014-07-23 DIAGNOSIS — R1312 Dysphagia, oropharyngeal phase: Secondary | ICD-10-CM

## 2014-07-23 NOTE — Assessment & Plan Note (Signed)
Continue celexa. She is non verbal and it would be difficult to assess her level of depression and responsiveness to meds secondary to her condition. However, given that previous trials have failed and there are no side effects noted, I would continue this med at this time.

## 2014-07-23 NOTE — Assessment & Plan Note (Signed)
Her condition is stable. She is comfort care. She requires assistance with all ADL's. Continue nursing care and monitor for further decline.

## 2014-07-23 NOTE — Progress Notes (Signed)
Patient ID: Jessica Hinton, female   DOB: 09/20/1930, 80 y.o.   MRN: [id number]    Nursing Home Location:  [facility]   Place of Service: SNF (31)  PCP: Kimber RelicGREEN, ARTHUR G, MD  No Known Allergies  Chief Complaint  Patient presents with  . Medical Management of Chronic Issues    HPI:  Patient is a 80 y.o. female [facility]. She has progressive supranuclear palsy. She is non ambulatory and non verbal. She requires assistance for all ADL's. The staff has reported worsening contractures noted in the left hand with grimacing during therapy. She was previously started on Baclofen and this seems to help. Her weight and VS are fairly stable at this time.   Review of Systems:  Review of Systems  Unable to perform ROS: Patient nonverbal    Past Medical History  Diagnosis Date  . Other and unspecified hyperlipidemia 04/22/2013  . Depressive disorder, not elsewhere classified 04/22/2013  . Other degenerative diseases of the basal ganglia 04/22/2013  . Aphasia 04/22/2013    Re: PSP   . Encounter for long-term (current) use of other medications 04/22/2013  . Anxiety state, unspecified 2011  . Chronic diastolic heart failure 2011    Grade1 by Echocardiogram  . Closed fracture of medial malleolus 07/2012    chip fracture of right medial malleolus 10 2013  . Closed fracture of one rib 2011    Left 10th rib 12/2009  . Dysphagia, oropharyngeal phase 2012  . Full incontinence of feces 2013    re: PSP  . Unspecified essential hypertension 2011  . Unspecified hypothyroidism 2011  . Unspecified urinary incontinence 2013    re: PSP  . Personal history of malignant neoplasm of breast 2006    Status post lumpectomy radiation 2006  . 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 Toxic effect of inorganic lead compounds(984.0) 2012  . Dizziness and giddiness 2011  . Other B-complex deficiencies 2013  . Mild vitamin D deficiency 2012   . Contracture of elbow joint 04/22/2013  . Advanced care planning/counseling discussion 09/02/2013    MOST form 09/02/2013   Past Surgical History  Procedure Laterality Date  . Orif rt. elbow Right 2001  . Breast lumpectomy  2006   Social History:   reports that she has never smoked. She does not have any smokeless tobacco history on file. Her alcohol and drug histories are not on file.  History reviewed. No pertinent family history.  Medications: Patient's Medications  New Prescriptions   No medications on file  Previous Medications   ACETAMINOPHEN (TYLENOL) 325 MG TABLET    Take 650 mg by mouth 2 (two) times daily.   ARTIFICIAL SALIVA (BIOTENE MOISTURIZING MOUTH) SOLN    Use as directed 1 spray in the mouth or throat 4 (four) times daily.   BACLOFEN (LIORESAL) 10 MG TABLET    Take 10 mg by mouth 3 (three) times daily.   CITALOPRAM (CELEXA) 20 MG TABLET    Take 20 mg by mouth daily.   TRAMADOL (ULTRAM) 50 MG TABLET    Take 50 mg by mouth every 6 (six) hours as needed (give prior to care of therapy).  Modified Medications   No medications on file  Discontinued Medications   No medications on file     Physical Exam: Filed Vitals:   07/23/14 1121  BP: 103/63  Pulse: 73  Temp: 98.7 F (37.1 C)  Resp: 18  Weight:  118 lb (53.524 kg)  SpO2: 93%    Physical Exam  HENT:  Head: Normocephalic and atraumatic.  Mouth/Throat: No oropharyngeal exudate.  Eyes: Conjunctivae are normal. Pupils are equal, round, and reactive to light.  Cardiovascular: Normal rate, regular rhythm and normal heart sounds.   Pulmonary/Chest: Effort normal and breath sounds normal.  Abdominal: Soft. Bowel sounds are normal.  Musculoskeletal: She exhibits no edema or tenderness.  Decreased ROM to shoulders, knees, elbows, neck, and hands. Grimacing elicited with ROM to the left hand  Skin: Skin is warm and dry.  Psychiatric:  Non verbal, flat affect   Labs reviewed: No recent labs per  famiy    Assessment/Plan Contracture of hand joint Continue Baclofen as the staff reports improvement. Begin Ultram 50mg  q6 prn for pain and prior to therapy and care. She has contractures to all joints but during the exam the left hand was the only joint that elicited pain on ROM  Other degenerative diseases of the basal ganglia Her condition is stable. She is comfort care. She requires assistance with all ADL's. Continue nursing care and monitor for further decline.   Dysphagia, oropharyngeal She continues on a puree diet with NTL. No recent bouts of pna. Continue to monitor.   Depressive disorder, not elsewhere classified Continue celexa. She is non verbal and it would be difficult to assess her level of depression and responsiveness to meds secondary to her condition. However, given that previous trials have failed and there are no side effects noted, I would continue this med at this time.

## 2014-07-23 NOTE — Assessment & Plan Note (Signed)
She continues on a puree diet with NTL. No recent bouts of pna. Continue to monitor.

## 2014-07-23 NOTE — Assessment & Plan Note (Addendum)
Continue Baclofen as the staff reports improvement. Begin Ultram 50mg  q6 prn for pain and prior to therapy and care. She has contractures to all joints but during the exam the left hand was the only joint that elicited pain on ROM

## 2014-08-18 ENCOUNTER — Non-Acute Institutional Stay (SKILLED_NURSING_FACILITY): Payer: Medicare Other | Admitting: Adult Health

## 2014-08-18 DIAGNOSIS — R1312 Dysphagia, oropharyngeal phase: Secondary | ICD-10-CM

## 2014-08-18 DIAGNOSIS — L8991 Pressure ulcer of unspecified site, stage 1: Secondary | ICD-10-CM

## 2014-08-18 DIAGNOSIS — M24542 Contracture, left hand: Secondary | ICD-10-CM

## 2014-08-20 ENCOUNTER — Encounter: Payer: Self-pay | Admitting: Adult Health

## 2014-08-20 DIAGNOSIS — L8991 Pressure ulcer of unspecified site, stage 1: Secondary | ICD-10-CM | POA: Insufficient documentation

## 2014-08-20 NOTE — Assessment & Plan Note (Signed)
She is NPO now due to her inability to swallow. She most likely has an aspiration pna. I agree with the family's decision to provide comfort care only.

## 2014-08-20 NOTE — Assessment & Plan Note (Signed)
Continue Roxanol 5mg  prn pain

## 2014-08-20 NOTE — Assessment & Plan Note (Signed)
Float heels

## 2014-08-20 NOTE — Progress Notes (Signed)
    Patient ID: Jessica BoyersManitta Swayne, female   DOB: 07/11/1931, 80 y.o.   MRN: [id number]  Nursing Home Location:  [facility]   Code Status: DNR comfort measures   Place of Service: SNF (31)  Chief Complaint  Patient presents with  . Acute Visit    fever, pain    HPI:  80 y.o. female [facility], [facility]. I was asked to see her today due to fever and pain. She has a hx of supranuclear palsy that has progressed to the point where she is non verbal, immobile and contracted. She apparently spiked a fever of 101 and due to the progressive nature of her disease the family has chosen not to treat her condition. Hospice has been called in to evaluate her. The staff has asked for pain medicines because the resident is grimacing, possibly due to some contractures that she has to her hands. Hospice ordered Roxanol and this seems to help.    Review of Systems:  Review of Systems  Unable to perform ROS: Patient nonverbal    Medications: Patient's Medications  New Prescriptions   No medications on file  Previous Medications   ACETAMINOPHEN (TYLENOL) 325 MG TABLET    Take 650 mg by mouth 2 (two) times daily.   ARTIFICIAL SALIVA (BIOTENE MOISTURIZING MOUTH) SOLN    Use as directed 1 spray in the mouth or throat 4 (four) times daily.   BACLOFEN (LIORESAL) 10 MG TABLET    Take 10 mg by mouth 3 (three) times daily.   CITALOPRAM (CELEXA) 20 MG TABLET    Take 20 mg by mouth daily.   TRAMADOL (ULTRAM) 50 MG TABLET    Take 50 mg by mouth every 6 (six) hours as needed (give prior to care of therapy).  Modified Medications   No medications on file  Discontinued Medications   No medications on file     Physical Exam:  There were no vitals filed for this visit. The family has declined VS monitoring  Physical Exam  Constitutional: No distress.  Frail, elderly, feels warm to touch  Cardiovascular: Normal rate and regular rhythm.   No  murmur heard. Bounding HS  Pulmonary/Chest: Effort normal. No respiratory distress. She has no wheezes.  bilat rhonchi  Abdominal: Soft. Bowel sounds are normal. She exhibits no distension. There is no tenderness.  Neurological:  Sleepy, non verbal, unable to f/c  Skin: Skin is warm and dry. She is not diaphoretic. There is erythema.  Red heels that blanch    Assessment/Plan  Dysphagia, oropharyngeal She is NPO now due to her inability to swallow. She most likely has an aspiration pna. I agree with the family's decision to provide comfort care only.  Contracture of hand joint Continue Roxanol 5mg  prn pain  Pressure pre-ulcer skin changes limited to persistent focal erythema Float heels    Peggye Leyhristy Carvel Huskins, ANP [facility] [phone]

## 2014-09-19 DEATH — deceased
